# Patient Record
Sex: Male | Born: 1945
Health system: Southern US, Community
[De-identification: ages and names within clinical notes are randomized; demographics above are authoritative.]

---

## 2014-08-31 DIAGNOSIS — H402211 Chronic angle-closure glaucoma, right eye, mild stage: Secondary | ICD-10-CM | POA: Diagnosis not present

## 2014-09-22 DIAGNOSIS — H402211 Chronic angle-closure glaucoma, right eye, mild stage: Secondary | ICD-10-CM | POA: Diagnosis not present

## 2014-10-27 DIAGNOSIS — H524 Presbyopia: Secondary | ICD-10-CM | POA: Diagnosis not present

## 2014-10-27 DIAGNOSIS — H40033 Anatomical narrow angle, bilateral: Secondary | ICD-10-CM | POA: Diagnosis not present

## 2015-01-23 DIAGNOSIS — S83242A Other tear of medial meniscus, current injury, left knee, initial encounter: Secondary | ICD-10-CM | POA: Diagnosis not present

## 2015-01-30 DIAGNOSIS — D485 Neoplasm of uncertain behavior of skin: Secondary | ICD-10-CM | POA: Diagnosis not present

## 2015-01-30 DIAGNOSIS — L821 Other seborrheic keratosis: Secondary | ICD-10-CM | POA: Diagnosis not present

## 2015-01-30 DIAGNOSIS — D1801 Hemangioma of skin and subcutaneous tissue: Secondary | ICD-10-CM | POA: Diagnosis not present

## 2015-01-30 DIAGNOSIS — M25562 Pain in left knee: Secondary | ICD-10-CM | POA: Diagnosis not present

## 2015-01-30 DIAGNOSIS — D2239 Melanocytic nevi of other parts of face: Secondary | ICD-10-CM | POA: Diagnosis not present

## 2015-01-30 DIAGNOSIS — M25662 Stiffness of left knee, not elsewhere classified: Secondary | ICD-10-CM | POA: Diagnosis not present

## 2015-01-30 DIAGNOSIS — M25462 Effusion, left knee: Secondary | ICD-10-CM | POA: Diagnosis not present

## 2015-01-30 DIAGNOSIS — D225 Melanocytic nevi of trunk: Secondary | ICD-10-CM | POA: Diagnosis not present

## 2015-02-05 DIAGNOSIS — M25662 Stiffness of left knee, not elsewhere classified: Secondary | ICD-10-CM | POA: Diagnosis not present

## 2015-02-05 DIAGNOSIS — M25462 Effusion, left knee: Secondary | ICD-10-CM | POA: Diagnosis not present

## 2015-02-05 DIAGNOSIS — M25562 Pain in left knee: Secondary | ICD-10-CM | POA: Diagnosis not present

## 2015-02-06 DIAGNOSIS — E291 Testicular hypofunction: Secondary | ICD-10-CM | POA: Diagnosis not present

## 2015-02-06 DIAGNOSIS — N401 Enlarged prostate with lower urinary tract symptoms: Secondary | ICD-10-CM | POA: Diagnosis not present

## 2015-02-13 DIAGNOSIS — M25662 Stiffness of left knee, not elsewhere classified: Secondary | ICD-10-CM | POA: Diagnosis not present

## 2015-02-13 DIAGNOSIS — M25462 Effusion, left knee: Secondary | ICD-10-CM | POA: Diagnosis not present

## 2015-02-13 DIAGNOSIS — M25562 Pain in left knee: Secondary | ICD-10-CM | POA: Diagnosis not present

## 2015-02-20 DIAGNOSIS — M25662 Stiffness of left knee, not elsewhere classified: Secondary | ICD-10-CM | POA: Diagnosis not present

## 2015-02-20 DIAGNOSIS — M25562 Pain in left knee: Secondary | ICD-10-CM | POA: Diagnosis not present

## 2015-02-20 DIAGNOSIS — M25462 Effusion, left knee: Secondary | ICD-10-CM | POA: Diagnosis not present

## 2015-02-27 DIAGNOSIS — M25662 Stiffness of left knee, not elsewhere classified: Secondary | ICD-10-CM | POA: Diagnosis not present

## 2015-02-27 DIAGNOSIS — M25562 Pain in left knee: Secondary | ICD-10-CM | POA: Diagnosis not present

## 2015-02-27 DIAGNOSIS — M25462 Effusion, left knee: Secondary | ICD-10-CM | POA: Diagnosis not present

## 2015-03-02 DIAGNOSIS — H40033 Anatomical narrow angle, bilateral: Secondary | ICD-10-CM | POA: Diagnosis not present

## 2015-03-14 DIAGNOSIS — M545 Low back pain: Secondary | ICD-10-CM | POA: Diagnosis not present

## 2015-03-14 DIAGNOSIS — M9903 Segmental and somatic dysfunction of lumbar region: Secondary | ICD-10-CM | POA: Diagnosis not present

## 2015-03-14 DIAGNOSIS — M9902 Segmental and somatic dysfunction of thoracic region: Secondary | ICD-10-CM | POA: Diagnosis not present

## 2015-03-14 DIAGNOSIS — M9904 Segmental and somatic dysfunction of sacral region: Secondary | ICD-10-CM | POA: Diagnosis not present

## 2015-03-14 DIAGNOSIS — M791 Myalgia: Secondary | ICD-10-CM | POA: Diagnosis not present

## 2015-04-10 DIAGNOSIS — Z6833 Body mass index (BMI) 33.0-33.9, adult: Secondary | ICD-10-CM | POA: Diagnosis not present

## 2015-04-10 DIAGNOSIS — H6692 Otitis media, unspecified, left ear: Secondary | ICD-10-CM | POA: Diagnosis not present

## 2015-04-18 DIAGNOSIS — F419 Anxiety disorder, unspecified: Secondary | ICD-10-CM | POA: Diagnosis not present

## 2015-04-18 DIAGNOSIS — Z0000 Encounter for general adult medical examination without abnormal findings: Secondary | ICD-10-CM | POA: Diagnosis not present

## 2015-04-18 DIAGNOSIS — E785 Hyperlipidemia, unspecified: Secondary | ICD-10-CM | POA: Diagnosis not present

## 2015-04-18 DIAGNOSIS — I10 Essential (primary) hypertension: Secondary | ICD-10-CM | POA: Diagnosis not present

## 2015-04-18 DIAGNOSIS — Z9181 History of falling: Secondary | ICD-10-CM | POA: Diagnosis not present

## 2015-04-18 DIAGNOSIS — E1142 Type 2 diabetes mellitus with diabetic polyneuropathy: Secondary | ICD-10-CM | POA: Diagnosis not present

## 2015-04-18 DIAGNOSIS — Z1389 Encounter for screening for other disorder: Secondary | ICD-10-CM | POA: Diagnosis not present

## 2015-04-18 DIAGNOSIS — Z23 Encounter for immunization: Secondary | ICD-10-CM | POA: Diagnosis not present

## 2015-04-18 DIAGNOSIS — Z139 Encounter for screening, unspecified: Secondary | ICD-10-CM | POA: Diagnosis not present

## 2015-04-18 DIAGNOSIS — Z79899 Other long term (current) drug therapy: Secondary | ICD-10-CM | POA: Diagnosis not present

## 2015-05-01 DIAGNOSIS — H1013 Acute atopic conjunctivitis, bilateral: Secondary | ICD-10-CM | POA: Diagnosis not present

## 2015-06-08 DIAGNOSIS — N401 Enlarged prostate with lower urinary tract symptoms: Secondary | ICD-10-CM | POA: Diagnosis not present

## 2015-06-08 DIAGNOSIS — E291 Testicular hypofunction: Secondary | ICD-10-CM | POA: Diagnosis not present

## 2015-07-02 DIAGNOSIS — J208 Acute bronchitis due to other specified organisms: Secondary | ICD-10-CM | POA: Diagnosis not present

## 2015-07-02 DIAGNOSIS — H402231 Chronic angle-closure glaucoma, bilateral, mild stage: Secondary | ICD-10-CM | POA: Diagnosis not present

## 2015-07-12 DIAGNOSIS — J208 Acute bronchitis due to other specified organisms: Secondary | ICD-10-CM | POA: Diagnosis not present

## 2015-07-12 DIAGNOSIS — Z6836 Body mass index (BMI) 36.0-36.9, adult: Secondary | ICD-10-CM | POA: Diagnosis not present

## 2015-07-12 DIAGNOSIS — R0981 Nasal congestion: Secondary | ICD-10-CM | POA: Diagnosis not present

## 2019-08-08 DIAGNOSIS — E291 Testicular hypofunction: Secondary | ICD-10-CM | POA: Diagnosis not present

## 2019-08-17 DIAGNOSIS — E119 Type 2 diabetes mellitus without complications: Secondary | ICD-10-CM | POA: Diagnosis not present

## 2019-08-17 DIAGNOSIS — H40033 Anatomical narrow angle, bilateral: Secondary | ICD-10-CM | POA: Diagnosis not present

## 2019-08-17 DIAGNOSIS — Z961 Presence of intraocular lens: Secondary | ICD-10-CM | POA: Diagnosis not present

## 2019-08-23 DIAGNOSIS — E291 Testicular hypofunction: Secondary | ICD-10-CM | POA: Diagnosis not present

## 2019-09-05 DIAGNOSIS — E291 Testicular hypofunction: Secondary | ICD-10-CM | POA: Diagnosis not present

## 2019-09-19 DIAGNOSIS — E291 Testicular hypofunction: Secondary | ICD-10-CM | POA: Diagnosis not present

## 2019-10-03 DIAGNOSIS — E291 Testicular hypofunction: Secondary | ICD-10-CM | POA: Diagnosis not present

## 2019-10-18 DIAGNOSIS — F419 Anxiety disorder, unspecified: Secondary | ICD-10-CM | POA: Diagnosis not present

## 2019-10-18 DIAGNOSIS — E559 Vitamin D deficiency, unspecified: Secondary | ICD-10-CM | POA: Diagnosis not present

## 2019-10-18 DIAGNOSIS — F329 Major depressive disorder, single episode, unspecified: Secondary | ICD-10-CM | POA: Diagnosis not present

## 2019-10-18 DIAGNOSIS — E291 Testicular hypofunction: Secondary | ICD-10-CM | POA: Diagnosis not present

## 2019-10-18 DIAGNOSIS — I10 Essential (primary) hypertension: Secondary | ICD-10-CM | POA: Diagnosis not present

## 2019-10-18 DIAGNOSIS — E785 Hyperlipidemia, unspecified: Secondary | ICD-10-CM | POA: Diagnosis not present

## 2019-10-18 DIAGNOSIS — E1165 Type 2 diabetes mellitus with hyperglycemia: Secondary | ICD-10-CM | POA: Diagnosis not present

## 2019-10-18 DIAGNOSIS — E1142 Type 2 diabetes mellitus with diabetic polyneuropathy: Secondary | ICD-10-CM | POA: Diagnosis not present

## 2019-10-18 DIAGNOSIS — S83242A Other tear of medial meniscus, current injury, left knee, initial encounter: Secondary | ICD-10-CM | POA: Diagnosis not present

## 2019-10-20 DIAGNOSIS — M25562 Pain in left knee: Secondary | ICD-10-CM | POA: Diagnosis not present

## 2019-10-20 DIAGNOSIS — M1712 Unilateral primary osteoarthritis, left knee: Secondary | ICD-10-CM | POA: Diagnosis not present

## 2019-11-04 DIAGNOSIS — E291 Testicular hypofunction: Secondary | ICD-10-CM | POA: Diagnosis not present

## 2019-11-18 DIAGNOSIS — E291 Testicular hypofunction: Secondary | ICD-10-CM | POA: Diagnosis not present

## 2019-12-06 DIAGNOSIS — E291 Testicular hypofunction: Secondary | ICD-10-CM | POA: Diagnosis not present

## 2019-12-09 DIAGNOSIS — H6692 Otitis media, unspecified, left ear: Secondary | ICD-10-CM | POA: Diagnosis not present

## 2019-12-09 DIAGNOSIS — J301 Allergic rhinitis due to pollen: Secondary | ICD-10-CM | POA: Diagnosis not present

## 2019-12-23 DIAGNOSIS — E291 Testicular hypofunction: Secondary | ICD-10-CM | POA: Diagnosis not present

## 2020-01-18 DIAGNOSIS — F331 Major depressive disorder, recurrent, moderate: Secondary | ICD-10-CM | POA: Diagnosis not present

## 2020-01-18 DIAGNOSIS — I10 Essential (primary) hypertension: Secondary | ICD-10-CM | POA: Diagnosis not present

## 2020-01-18 DIAGNOSIS — E291 Testicular hypofunction: Secondary | ICD-10-CM | POA: Diagnosis not present

## 2020-01-18 DIAGNOSIS — F419 Anxiety disorder, unspecified: Secondary | ICD-10-CM | POA: Diagnosis not present

## 2020-01-18 DIAGNOSIS — E1165 Type 2 diabetes mellitus with hyperglycemia: Secondary | ICD-10-CM | POA: Diagnosis not present

## 2020-01-18 DIAGNOSIS — E1142 Type 2 diabetes mellitus with diabetic polyneuropathy: Secondary | ICD-10-CM | POA: Diagnosis not present

## 2020-01-18 DIAGNOSIS — Z125 Encounter for screening for malignant neoplasm of prostate: Secondary | ICD-10-CM | POA: Diagnosis not present

## 2020-01-18 DIAGNOSIS — E785 Hyperlipidemia, unspecified: Secondary | ICD-10-CM | POA: Diagnosis not present

## 2020-01-18 DIAGNOSIS — E559 Vitamin D deficiency, unspecified: Secondary | ICD-10-CM | POA: Diagnosis not present

## 2020-01-20 DIAGNOSIS — E291 Testicular hypofunction: Secondary | ICD-10-CM | POA: Diagnosis not present

## 2020-02-13 NOTE — Progress Notes (Signed)
New Patient Note  RE: Devin Snyder MRN: [id number] DOB: [birth year] Date of Office Visit: 02/14/2020  Referring provider: No ref. provider found Primary care provider: Nicoletta Dress, MD  Chief Complaint: Allergic Rhinitis   History of Present Illness: I had the pleasure of seeing Devin Snyder for initial evaluation at the [facility] on 02/14/2020. He is a 74 y.o. male, who is self-referred here for the evaluation of environmental and food allergies.  Rhinitis:  He reports symptoms of PND, increase mucous production, itchy eyes. Symptoms have been going on for 4 years. The symptoms are present  all year around with worsening in the spring and fall and at night. Other triggers include exposure to pollen. Anosmia: no. Headache: no. He has used allegra, zyrtec, Claritin with no benefit. Tried azelastine 2 sprays per nostril with minimal improvement in symptoms. Sinus infections: once. Previous work up includes: 12 years ago testing was positive to dust mites, pollen and mold per patient report. No previous allergy injections  Previous ENT evaluation: no. Previous sinus imaging: no. History of nasal polyps: no. History of reflux: not on medications.  Food: Currently avoiding dairy, eggs, limiting red meat.   Symptoms started within 20-30 minutes and was in the form of PND and mucous production. Denies any hives, swelling, wheezing, abdominal pain, diarrhea, vomiting. Denies any associated cofactors such as exertion, infection, NSAID use, or alcohol consumption. The symptoms lasted for up to 1 hour. He was not evaluated in ED. He does not have access to epinephrine autoinjector.  Peanuts cause diarrhea the following day.   Wheat products cause less severe reactions.   Past work up includes: none. Dietary History: patient has been eating other foods including treenuts, shellfish, seafood, soy, meats, fruits and vegetables. No prior sesame ingestion.   Assessment  and Plan: Brook is a 74 y.o. male with: Other allergic rhinitis Perennial rhino conjunctivitis symptoms for the past 4 years with worsening in the spring, fall and at night. Main complaint is the PND and mucous production. Tried allegra, zyrtec, Claritin and azelastine with minimal benefit. Skin testing 12 years ago positive to dust mites, pollen and mold per patient report. No previous allergy injections. No prior ENT evaluation. Some heartburn symptoms but not on medications.  Today's skin testing showed: Positive to grass, ragweed, weed, trees, mold, cockroach and dust mites.  Start environmental control measures as below.  Start Karbinal 3m tablet twice a day. Sample given.   [place] if it's not covered.   Start Atrovent nasal spray 2 sprays per nostril twice a day for drainage.  Nasal saline spray (i.e., Simply Saline) or nasal saline lavage (i.e., NeilMed) is recommended as needed and prior to medicated nasal sprays.  If no improvement will recommend ENT referral next and starting PPI.  See below for heartburn lifestyle modifications diet.  Read about allergy injections.   Coughing The excessive PND and mucous sometimes leads to his coughing. Using albuterol a few times a day with good benefit. No recent CXR.  Today's spirometry was unremarkable.  Discussed with patient at length to only use albuterol if he experiencing symptoms of shortness of breath, chest tightness, coughing fits and wheezing and NOT to use for nasal congestion.   May use albuterol rescue inhaler 2 puffs every 4 to 6 hours as needed for shortness of breath, chest tightness, coughing, and wheezing. Monitor frequency of use.   If no improvement with above regimen will get CXR at next  visit.  Heartburn  Gave handout on reflux lifestyle modification.   Adverse food reaction Noted that certain foods such as dairy, eggs, red meat and wheat causes worsening PND and mucous production. Symptoms lasts for  1 hour. Peanuts cause diarrhea the following day.   Today's skin testing was negative to select foods.  Avoid the foods that bother you - dairy, egg, red meat, and peanuts.  Discussed with patient that we can discuss reintroduction at the next visit once his above symptoms improve. May need to get bloodwork for these selects foods prior to reintroduction.   Return in about 2 months (around 04/16/2020).  Meds ordered this encounter  Medications  . Carbinoxamine Maleate (RYVENT) 6 MG TABS    Sig: Take 1 tablet by mouth in the morning and at bedtime.    Dispense:  60 tablet    Refill:  5  . ipratropium (ATROVENT) 0.03 % nasal spray    Sig: Place 2 sprays into both nostrils every 12 (twelve) hours. For drainage.    Dispense:  30 mL    Refill:  5   Other allergy screening: Asthma:  Patient uses albuterol as needed about a few times a day with good benefit. He has used it for the past few years. No recent CXR or breathing test.  Medication allergy: no Hymenoptera allergy: no Urticaria: no Eczema:no History of recurrent infections suggestive of immunodeficency: no  Diagnostics: Spirometry:  Tracings reviewed. His effort: It was hard to get consistent efforts and there is a question as to whether this reflects a maximal maneuver. FVC: 4.02L FEV1: 3.23L, 115% predicted FEV1/FVC ratio: 80% Interpretation: No overt abnormalities noted given today's efforts.  Please see scanned spirometry results for details.  Skin Testing: Environmental allergy panel and select foods. Positive to grass, ragweed, weed, trees, mold, cockroach and dust mites. Negative test to: select foods as below Results discussed with patient/family.  Airborne Adult Perc - 02/14/20 1448    Time Antigen Placed 1449    Allergen Manufacturer Lavella Hammock    Location Back    Number of Test 59    Panel 1 Select    1. Control-Buffer 50% Glycerol Negative    2. Control-Histamine 1 mg/ml 2+    3. Albumin saline Negative     4. [place] Negative    5. [place] 3+    6. Johnson Negative    7. [place] Blue Negative    8. Meadow Fescue Negative    9. Perennial Rye Negative    10. Sweet Vernal Negative    11. Timothy Negative    12. Cocklebur Negative    13. Burweed Marshelder Negative    14. Ragweed, short Negative    15. Ragweed, Giant Negative    16. Plantain,  English Negative    17. Lamb's Quarters Negative    18. Sheep Sorrell Negative    19. Rough Pigweed Negative    20. Marsh Elder, Rough Negative    21. Mugwort, Common Negative    22. Ash mix Negative    23. Birch mix Negative    24. Beech American Negative    25. Box, Elder Negative    26. Cedar, red Negative    27. Cottonwood, [place] Negative    28. Elm mix Negative    29. Hickory Negative    30. Maple mix Negative    31. Oak, [place] mix Negative    32. Pecan Pollen Negative    33. Pine mix Negative    34. Sycamore  Eastern Negative    35. Ritchie, Black Pollen Negative    36. Alternaria alternata Negative    37. Cladosporium Herbarum Negative    38. Aspergillus mix Negative    39. Penicillium mix Negative    40. Bipolaris sorokiniana (Helminthosporium) Negative    41. Drechslera spicifera (Curvularia) Negative    42. Mucor plumbeus Negative    43. Fusarium moniliforme Negative    44. Aureobasidium pullulans (pullulara) Negative    45. Rhizopus oryzae Negative    46. Botrytis cinera Negative    47. Epicoccum nigrum Negative    48. Phoma betae Negative    49. Candida Albicans Negative    50. Trichophyton mentagrophytes Negative    51. Mite, D Farinae  5,000 AU/ml Negative    52. Mite, D Pteronyssinus  5,000 AU/ml Negative    53. Cat Hair 10,000 BAU/ml Negative    54.  Dog Epithelia Negative    55. Mixed Feathers Negative    56. Horse Epithelia Negative    57. Cockroach, German Negative    58. Mouse Negative    59. Tobacco Leaf Negative          Intradermal - 02/14/20 1515    Time Antigen Placed 0320    Allergen Manufacturer  Lavella Hammock    Location Arm    Number of Test 14    Intradermal Select    Control Negative    Johnson 2+    7 Grass Negative    Ragweed mix 2+    Weed mix 2+    Tree mix 2+    Mold 1 2+    Mold 2 --   +/-   Mold 3 Negative    Mold 4 --   +/-   Cat Negative    Dog Negative    Cockroach 2+    Mite mix 2+          Food Adult Perc - 02/14/20 1400    Time Antigen Placed 1449    Allergen Manufacturer Lavella Hammock    Location Back    Panel 2 Select    Control-Histamine 1 mg/ml 2+    1. Peanut Negative    3. Wheat Negative    4. Sesame Negative    5. Milk, cow Negative    6. Egg White, Chicken Negative    7. Casein Negative    37. Pork Negative    40. Beef Negative    41. Lamb Negative           Past Medical History: Patient Active Problem List   Diagnosis Date Noted  . Adverse food reaction 02/14/2020  . Heartburn 02/14/2020  . Other allergic rhinitis 02/14/2020  . Coughing 02/14/2020   History reviewed. No pertinent past medical history. Past Surgical History: History reviewed. No pertinent surgical history. Medication List:  Current Outpatient Medications  Medication Sig Dispense Refill  . albuterol (VENTOLIN HFA) 108 (90 Base) MCG/ACT inhaler     . Alcohol Swabs (B-D SINGLE USE SWABS REGULAR) PADS     . ALPRAZolam (XANAX) 0.25 MG tablet Take 0.25 mg by mouth at bedtime as needed for anxiety.    Marland Kitchen azelastine (ASTELIN) 0.1 % nasal spray     . Blood Glucose Calibration (TRUE METRIX LEVEL 1) Low SOLN     . Blood Glucose Monitoring Suppl (TRUE METRIX AIR GLUCOSE METER) w/Device KIT     . Cholecalciferol (VITAMIN D-3) 125 MCG (5000 UT) TABS Take by mouth.    Marland Kitchen lisinopril (ZESTRIL) 20 MG  tablet     . metFORMIN (GLUCOPHAGE) 500 MG tablet     . Multiple Vitamin (MULTIVITAMINS PO) Take 1 tablet by mouth daily.    . Multiple Vitamins-Minerals (HAIR/SKIN/NAILS/BIOTIN PO) Take 5,000 mcg by mouth in the morning and at bedtime.    . simvastatin (ZOCOR) 20 MG tablet     . TRUE  METRIX BLOOD GLUCOSE TEST test strip     . TRUEplus Lancets 28G MISC     . venlafaxine XR (EFFEXOR-XR) 150 MG 24 hr capsule     . Carbinoxamine Maleate (RYVENT) 6 MG TABS Take 1 tablet by mouth in the morning and at bedtime. 60 tablet 5  . ipratropium (ATROVENT) 0.03 % nasal spray Place 2 sprays into both nostrils every 12 (twelve) hours. For drainage. 30 mL 5   No current facility-administered medications for this visit.   Allergies: No Known Allergies Social History: Social History   Socioeconomic History  . Marital status: Married    Spouse name: Not on file  . Number of children: Not on file  . Years of education: Not on file  . Highest education level: Not on file  Occupational History  . Not on file  Tobacco Use  . Smoking status: Never Smoker  . Smokeless tobacco: Never Used  Vaping Use  . Vaping Use: Never used  Substance and Sexual Activity  . Alcohol use: Never  . Drug use: Never  . Sexual activity: Not on file  Other Topics Concern  . Not on file  Social History Narrative  . Not on file   Social Determinants of Health   Financial Resource Strain:   . Difficulty of Paying Living Expenses:   Food Insecurity:   . Worried About Charity fundraiser in the Last Year:   . Arboriculturist in the Last Year:   Transportation Needs:   . Film/video editor (Medical):   Marland Kitchen Lack of Transportation (Non-Medical):   Physical Activity:   . Days of Exercise per Week:   . Minutes of Exercise per Session:   Stress:   . Feeling of Stress :   Social Connections:   . Frequency of Communication with Friends and Family:   . Frequency of Social Gatherings with Friends and Family:   . Attends Religious Services:   . Active Member of Clubs or Organizations:   . Attends Archivist Meetings:   Marland Kitchen Marital Status:    Lives in an 73 year old home. Smoking: denies Occupation: retired  Programme researcher, broadcasting/film/video HistoryFreight forwarder in the house: no Charity fundraiser in the family  room: no Carpet in the bedroom: no Heating: electric Cooling: central Pet: yes 3 dogs x 12 yrs  Family History: History reviewed. No pertinent family history. Problem                               Relation Asthma                                   No  Eczema                                No  Food allergy  Brother  Allergic rhino conjunctivitis     No   Review of Systems  Constitutional: Negative for appetite change, chills, fever and unexpected weight change.  HENT: Positive for postnasal drip. Negative for congestion, rhinorrhea and sneezing.   Eyes: Negative for itching.  Respiratory: Negative for cough, chest tightness, shortness of breath and wheezing.   Cardiovascular: Negative for chest pain.  Gastrointestinal: Negative for abdominal pain.  Genitourinary: Negative for difficulty urinating.  Skin: Negative for rash.  Allergic/Immunologic: Positive for environmental allergies.  Neurological: Negative for headaches.   Objective: BP (!) 134/82   Pulse 103   Temp (!) 97.2 F (36.2 C) (Temporal)   Resp 17   Ht 5' 7.72" (1.72 m)   Wt 197 lb 4 oz (89.5 kg)   SpO2 97%   BMI 30.24 kg/m  Body mass index is 30.24 kg/m. Physical Exam Vitals and nursing note reviewed.  Constitutional:      Appearance: Normal appearance. He is well-developed.  HENT:     Head: Normocephalic and atraumatic.     Right Ear: External ear normal.     Left Ear: External ear normal.     Nose: Nose normal.     Mouth/Throat:     Mouth: Mucous membranes are moist.     Pharynx: Oropharynx is clear.  Eyes:     Conjunctiva/sclera: Conjunctivae normal.  Cardiovascular:     Rate and Rhythm: Normal rate and regular rhythm.     Heart sounds: Normal heart sounds. No murmur heard.  No friction rub. No gallop.   Pulmonary:     Effort: Pulmonary effort is normal.     Breath sounds: Normal breath sounds. No wheezing, rhonchi or rales.  Abdominal:     Palpations: Abdomen is soft.    Musculoskeletal:     Cervical back: Neck supple.  Skin:    General: Skin is warm.     Findings: No rash.  Neurological:     Mental Status: He is alert and oriented to person, place, and time.  Psychiatric:        Behavior: Behavior normal.    The plan was reviewed with the patient/family, and all questions/concerned were addressed.  It was my pleasure to see Lejuan today and participate in his care. Please feel free to contact me with any questions or concerns.  Sincerely,  Rexene Alberts, DO Allergy & Immunology  [facility] office: [phone] [facility] office: [place] office: [phone]

## 2020-02-14 ENCOUNTER — Ambulatory Visit: Payer: Self-pay | Admitting: Allergy

## 2020-02-14 ENCOUNTER — Other Ambulatory Visit: Payer: Self-pay

## 2020-02-14 ENCOUNTER — Encounter: Payer: Self-pay | Admitting: Allergy

## 2020-02-14 VITALS — BP 134/82 | HR 103 | Temp 97.2°F | Resp 17 | Ht 67.72 in | Wt 197.2 lb

## 2020-02-14 DIAGNOSIS — R059 Cough, unspecified: Secondary | ICD-10-CM | POA: Insufficient documentation

## 2020-02-14 DIAGNOSIS — R12 Heartburn: Secondary | ICD-10-CM

## 2020-02-14 DIAGNOSIS — J3089 Other allergic rhinitis: Secondary | ICD-10-CM | POA: Diagnosis not present

## 2020-02-14 DIAGNOSIS — R05 Cough: Secondary | ICD-10-CM | POA: Diagnosis not present

## 2020-02-14 DIAGNOSIS — T781XXD Other adverse food reactions, not elsewhere classified, subsequent encounter: Secondary | ICD-10-CM | POA: Diagnosis not present

## 2020-02-14 DIAGNOSIS — T781XXA Other adverse food reactions, not elsewhere classified, initial encounter: Secondary | ICD-10-CM | POA: Insufficient documentation

## 2020-02-14 MED ORDER — IPRATROPIUM BROMIDE 0.03 % NA SOLN: 2.0000 | Freq: Two times a day (BID) | NASAL | 5 refills | Status: DC

## 2020-02-14 MED ORDER — CARBINOXAMINE MALEATE 6 MG PO TABS: 1.0000 | ORAL_TABLET | Freq: Two times a day (BID) | ORAL | 5 refills | Status: DC

## 2020-02-14 NOTE — Assessment & Plan Note (Addendum)
Perennial rhino conjunctivitis symptoms for the past 4 years with worsening in the spring, fall and at night. Main complaint is the PND and mucous production. Tried allegra, zyrtec, Claritin and azelastine with minimal benefit. Skin testing 12 years ago positive to dust mites, pollen and mold per patient report. No previous allergy injections. No prior ENT evaluation. Some heartburn symptoms but not on medications.  Today's skin testing showed: Positive to grass, ragweed, weed, trees, mold, cockroach and dust mites.  Start environmental control measures as below.  Start Karbinal 6mg  tablet twice a day. Sample given.   Let know if it's not covered.   Start Atrovent nasal spray 2 sprays per nostril twice a day for drainage.  Nasal saline spray (i.e., Simply Saline) or nasal saline lavage (i.e., NeilMed) is recommended as needed and prior to medicated nasal sprays.  If no improvement will recommend ENT referral next and starting PPI.  See below for heartburn lifestyle modifications diet.  Read about allergy injections.

## 2020-02-14 NOTE — Assessment & Plan Note (Addendum)
The excessive PND and mucous sometimes leads to his coughing. Using albuterol a few times a day with good benefit. No recent CXR.  Today's spirometry was unremarkable.  Discussed with patient at length to only use albuterol if he experiencing symptoms of shortness of breath, chest tightness, coughing fits and wheezing and NOT to use for nasal congestion.   May use albuterol rescue inhaler 2 puffs every 4 to 6 hours as needed for shortness of breath, chest tightness, coughing, and wheezing. Monitor frequency of use.   If no improvement with above regimen will get CXR at next visit.

## 2020-02-14 NOTE — Assessment & Plan Note (Signed)
Noted that certain foods such as dairy, eggs, red meat and wheat causes worsening PND and mucous production. Symptoms lasts for 1 hour. Peanuts cause diarrhea the following day.   Today's skin testing was negative to select foods.  Avoid the foods that bother you - dairy, egg, red meat, and peanuts.  Discussed with patient that we can discuss reintroduction at the next visit once his above symptoms improve. May need to get bloodwork for these selects foods prior to reintroduction.

## 2020-02-14 NOTE — Assessment & Plan Note (Signed)
   Gave handout on reflux lifestyle modification.

## 2020-02-14 NOTE — Patient Instructions (Addendum)
Today's skin testing showed: Positive to grass, ragweed, weed, trees, mold, cockroach and dust mites.  Environmental allergies  Start environmental control measures as below.  Start Karbinal 6mg  tablet twice a day. Sample given.   Let know if it's not covered.  Start atrovent nasal spray 2 sprays per nostril twice a day for drainage.  Nasal saline spray (i.e., Simply Saline) or nasal saline lavage (i.e., NeilMed) is recommended as needed and prior to medicated nasal sprays.  If no improvement will recommend ENT referral next.   See below for heartburn lifestyle modifications diet.  Read about allergy injections.   Breathing:  Only use if having issues with the cheat area and NOT for nasal congestion.   May use albuterol rescue inhaler 2 puffs every 4 to 6 hours as needed for shortness of breath, chest tightness, coughing, and wheezing. Monitor frequency of use.   Food:  Avoid the foods that bother you - dairy, egg, red meat, and peanuts.  Follow up in 2 months or sooner if needed.   Reducing Pollen Exposure . Pollen seasons: trees (spring), grass (summer) and ragweed/weeds (fall). [birth year]1980 Keep windows closed in your home and car to lower pollen exposure.  Marland Kitchen air conditioning in the bedroom and throughout the house if possible.  . Avoid going out in dry windy days - especially early morning. . Pollen counts are highest between 5 - 10 AM and on dry, hot and windy days.  . Save outside activities for late afternoon or after a heavy rain, when pollen levels are lower.  . Avoid mowing of grass if you have grass pollen allergy. Devin Snyder Be aware that pollen can also be transported indoors on people and pets.  . Dry your clothes in an automatic dryer rather than hanging them outside where they might collect pollen.  . Rinse hair and eyes before bedtime.  Mold Control . Mold and fungi can grow on a variety of surfaces provided certain temperature and moisture conditions exist.   . Outdoor molds grow on plants, decaying vegetation and soil. The major outdoor mold, Alternaria and Cladosporium, are found in very high numbers during hot and dry conditions. Generally, a late summer - fall peak is seen for common outdoor fungal spores. Rain will temporarily lower outdoor mold spore count, but counts rise rapidly when the rainy period ends. . The most important indoor molds are Aspergillus and Penicillium. Dark, humid and poorly ventilated basements are ideal sites for mold growth. The next most common sites of mold growth are the bathroom and the kitchen. Outdoor (Seasonal) Mold Control . Use air conditioning and keep windows closed. . Avoid exposure to decaying vegetation. 05-16-1975 Avoid leaf raking. . Avoid grain handling. . Consider wearing a face mask if working in moldy areas.  Indoor (Perennial) Mold Control  . Maintain humidity below 50%. . Get rid of mold growth on hard surfaces with water, detergent and, if necessary, 5% bleach (do not mix with other cleaners). Then dry the area completely. If mold covers an area more than 10 square feet, consider hiring an indoor environmental professional. . For clothing, washing with soap and water is best. If moldy items cannot be cleaned and dried, throw them away. . Remove sources e.g. contaminated carpets. . Repair and seal leaking roofs or pipes. Using dehumidifiers in damp basements may be helpful, but empty the water and clean units regularly to prevent mildew from forming. All rooms, especially basements, bathrooms and kitchens, require ventilation and cleaning to deter  mold and mildew growth. Avoid carpeting on concrete or damp floors, and storing items in damp areas. Cockroach Allergen Avoidance Cockroaches are often found in the homes of densely populated urban areas, schools or commercial buildings, but these creatures can lurk almost anywhere. This does not mean that you have a dirty house or living area. . Block all areas  where roaches can enter the home. This includes crevices, wall cracks and windows.  . Cockroaches need water to survive, so fix and seal all leaky faucets and pipes. Have an exterminator go through the house when your family and pets are gone to eliminate any remaining roaches. Marland Kitchen. Keep food in lidded containers and put pet food dishes away after your pets are done eating. Vacuum and sweep the floor after meals, and take out garbage and recyclables. Use lidded garbage containers in the kitchen. Wash dishes immediately after use and clean under stoves, refrigerators or toasters where crumbs can accumulate. Wipe off the stove and other kitchen surfaces and cupboards regularly. Control of House Dust Mite Allergen . Dust mite allergens are a common trigger of allergy and asthma symptoms. While they can be found throughout the house, these microscopic creatures thrive in warm, humid environments such as bedding, upholstered furniture and carpeting. . Because so much time is spent in the bedroom, it is essential to reduce mite levels there.  . Encase pillows, mattresses, and box springs in special allergen-proof fabric covers or airtight, zippered plastic covers.  . Bedding should be washed weekly in hot water (130 F) and dried in a hot dryer. Allergen-proof covers are available for comforters and pillows that can't be regularly washed.  Devin Snyder. Wash the allergy-proof covers every few months. Minimize clutter in the bedroom. Keep pets out of the bedroom.  Marland Kitchen. Keep humidity less than 50% by using a dehumidifier or air conditioning. You can buy a humidity measuring device called a hygrometer to monitor this.  . If possible, replace carpets with hardwood, linoleum, or washable area rugs. If that's not possible, vacuum frequently with a vacuum that has a HEPA filter. . Remove all upholstered furniture and non-washable window drapes from the bedroom. . Remove all non-washable stuffed toys from the bedroom.  Wash stuffed  toys weekly.  Buffered Isotonic Saline Irrigations:  Goal: . When you irrigate with the isotonic saline (salt water) it washes mucous and other debris from your nose that could be contributing to your nasal symptoms.   Recipe: Marland Kitchen. Obtain 1 quart jar that is clean . Fill with clean (bottled, boiled or distilled) water . Add 1-2 heaping teaspoons of salt without iodine o If the solution with 2 teaspoons of salt is too strong, adjust the amount down until better tolerated . Add 1 teaspoon of [employer] baking soda (pure bicarbonate) . Mix ingredients together and store at room temperature and discard after 1 week * Alternatively you can buy pre made salt packets for the NeilMed bottle or there          are other over the counter brands available  Instructions: . Warm  cup of the solution in the microwave if desired but be careful not to overheat as this will burn the inside of your nose . Stand over a sink (or do it while you shower) and squirt the solution into one side of your nose aiming towards the back of your head o Sometimes saying "coca cola" while irrigating can be helpful to prevent fluid from going down your throat  .  The solution will travel to the back of your nose and then come out the other side . Perform this again on the other side . Try to do this twice a day . If you are using a nasal spray in addition to the irrigation, irrigate first and then use the topical nasal spray otherwise you will wash the nasal spray out of your nose  Heartburn Heartburn is a type of pain or discomfort that can happen in the throat or chest. It is often described as a burning pain. It may also cause a bad, acid-like taste in the mouth. Heartburn may feel worse when you lie down or bend over. It may be worse at night. It may be caused by stomach contents that move back up (reflux) into the tube that connects the mouth with the stomach (esophagus). Follow these instructions at home: Eating and  drinking   Avoid certain foods and drinks as told by your doctor. This may include: ? Coffee and tea (with or without caffeine). ? Drinks that have alcohol. ? Energy drinks and sports drinks. ? Carbonated drinks or sodas. ? Chocolate and cocoa. ? Peppermint and mint flavorings. ? Garlic and onions. ? Horseradish. ? Spicy and acidic foods, such as:  Peppers.  Chili powder and curry powder.  Vinegar.  Hot sauces and BBQ sauce. ? Citrus fruit juices and citrus fruits, such as:  Oranges.  Lemons.  Limes. ? Tomato-based foods, such as:  Red sauce and pizza with red sauce.  Chili.  Salsa. ? Fried and fatty foods, such as:  Donuts.  [place] fries and potato chips.  High-fat dressings. ? High-fat meats, such as:  Hot dogs and sausage.  Rib eye steak.  Ham and bacon. ? High-fat dairy items, such as:  Whole milk.  Butter.  Cream cheese.  Eat small meals often. Avoid eating large meals.  Avoid drinking large amounts of liquid with your meals.  Avoid eating meals during the 2-3 hours before bedtime.  Avoid lying down right after you eat.  Do not exercise right after you eat. Lifestyle      If you are overweight, lose an amount of weight that is healthy for you. Ask your doctor about a safe weight loss goal.  Do not use any products that contain nicotine or tobacco, including cigarettes, e-cigarettes, and chewing tobacco. These can make your symptoms worse. If you need help quitting, ask your doctor.  Wear loose clothes. Do not wear anything tight around your waist.  Raise (elevate) the head of your bed about 6 inches (15 cm) when you sleep.  Try to lower your stress. If you need help doing this, ask your doctor. General instructions  Pay attention to any changes in your symptoms.  Take over-the-counter and prescription medicines only as told by your doctor. ? Do not take aspirin, ibuprofen, or other NSAIDs unless your doctor says it is  okay. ? Stop medicines only as told by your doctor.  Keep all follow-up visits as told by your doctor. This is important. Contact a doctor if:  You have new symptoms.  You lose weight and you do not know why it is happening.  You have trouble swallowing, or it hurts to swallow.  You have wheezing or a cough that keeps happening.  Your symptoms do not get better with treatment.  You have heartburn often for more than 2 weeks. Get help right away if:  You have pain in your arms, neck, jaw, teeth, or back.  You feel sweaty, dizzy, or light-headed.  You have chest pain or shortness of breath.  You throw up (vomit) and your throw up looks like blood or coffee grounds.  Your poop (stool) is bloody or black. These symptoms may represent a serious problem that is an emergency. Do not wait to see if the symptoms will go away. Get medical help right away. Call your local emergency services (911 in the [place] Do not drive yourself to the hospital. Summary  Heartburn is a type of pain that can happen in the throat or chest. It can feel like a burning pain. It may also cause a bad, acid-like taste in the mouth.  You may need to avoid certain foods and drinks to help your symptoms. Ask your doctor what foods and drinks you should avoid.  Take over-the-counter and prescription medicines only as told by your doctor. Do not take aspirin, ibuprofen, or other NSAIDs unless your doctor told you to do so.  Contact your doctor if your symptoms do not get better or they get worse. This information is not intended to replace advice given to you by your health care provider. Make sure you discuss any questions you have with your health care provider. Document Revised: 12/07/2017 Document Reviewed: 12/07/2017 Elsevier Patient Education  2020 [employer].

## 2020-02-15 DIAGNOSIS — E291 Testicular hypofunction: Secondary | ICD-10-CM | POA: Diagnosis not present

## 2020-02-23 DIAGNOSIS — N401 Enlarged prostate with lower urinary tract symptoms: Secondary | ICD-10-CM | POA: Diagnosis not present

## 2020-02-23 DIAGNOSIS — R972 Elevated prostate specific antigen [PSA]: Secondary | ICD-10-CM | POA: Diagnosis not present

## 2020-02-23 DIAGNOSIS — R351 Nocturia: Secondary | ICD-10-CM | POA: Diagnosis not present

## 2020-02-28 DIAGNOSIS — D1801 Hemangioma of skin and subcutaneous tissue: Secondary | ICD-10-CM | POA: Diagnosis not present

## 2020-02-28 DIAGNOSIS — D2239 Melanocytic nevi of other parts of face: Secondary | ICD-10-CM | POA: Diagnosis not present

## 2020-02-28 DIAGNOSIS — D225 Melanocytic nevi of trunk: Secondary | ICD-10-CM | POA: Diagnosis not present

## 2020-02-28 DIAGNOSIS — D485 Neoplasm of uncertain behavior of skin: Secondary | ICD-10-CM | POA: Diagnosis not present

## 2020-02-28 DIAGNOSIS — L814 Other melanin hyperpigmentation: Secondary | ICD-10-CM | POA: Diagnosis not present

## 2020-03-05 DIAGNOSIS — E291 Testicular hypofunction: Secondary | ICD-10-CM | POA: Diagnosis not present

## 2020-03-23 DIAGNOSIS — E291 Testicular hypofunction: Secondary | ICD-10-CM | POA: Diagnosis not present

## 2020-04-06 DIAGNOSIS — E291 Testicular hypofunction: Secondary | ICD-10-CM | POA: Diagnosis not present

## 2020-04-12 DIAGNOSIS — R351 Nocturia: Secondary | ICD-10-CM | POA: Diagnosis not present

## 2020-04-12 DIAGNOSIS — R972 Elevated prostate specific antigen [PSA]: Secondary | ICD-10-CM | POA: Diagnosis not present

## 2020-04-12 DIAGNOSIS — N401 Enlarged prostate with lower urinary tract symptoms: Secondary | ICD-10-CM | POA: Diagnosis not present

## 2020-04-17 ENCOUNTER — Ambulatory Visit
Admission: RE | Admit: 2020-04-17 | Discharge: 2020-04-17 | Disposition: A | Discharge status: Home / Self Care | Payer: Medicare PPO | Source: Ambulatory Visit | Attending: Allergy | Admitting: Allergy

## 2020-04-17 ENCOUNTER — Ambulatory Visit: Payer: Medicare PPO | Admitting: Allergy

## 2020-04-17 ENCOUNTER — Other Ambulatory Visit: Payer: Self-pay

## 2020-04-17 ENCOUNTER — Telehealth: Payer: Self-pay

## 2020-04-17 ENCOUNTER — Encounter: Payer: Self-pay | Admitting: Allergy

## 2020-04-17 VITALS — BP 140/92 | HR 104 | Temp 97.1°F | Resp 18

## 2020-04-17 DIAGNOSIS — J3089 Other allergic rhinitis: Secondary | ICD-10-CM | POA: Diagnosis not present

## 2020-04-17 DIAGNOSIS — R059 Cough, unspecified: Secondary | ICD-10-CM

## 2020-04-17 DIAGNOSIS — H1010 Acute atopic conjunctivitis, unspecified eye: Secondary | ICD-10-CM | POA: Insufficient documentation

## 2020-04-17 DIAGNOSIS — T781XXD Other adverse food reactions, not elsewhere classified, subsequent encounter: Secondary | ICD-10-CM | POA: Diagnosis not present

## 2020-04-17 DIAGNOSIS — J302 Other seasonal allergic rhinitis: Secondary | ICD-10-CM

## 2020-04-17 DIAGNOSIS — R05 Cough: Secondary | ICD-10-CM | POA: Diagnosis not present

## 2020-04-17 DIAGNOSIS — R12 Heartburn: Secondary | ICD-10-CM

## 2020-04-17 MED ORDER — MONTELUKAST SODIUM 10 MG PO TABS: 10.0000 mg | ORAL_TABLET | Freq: Every day | ORAL | 5 refills | Status: DC

## 2020-04-17 NOTE — Patient Instructions (Addendum)
Environmental allergies  Past skin testing showed: Positive to grass, ragweed, weed, trees, mold, cockroach and dust mites.  Continue environmental control measures as below.  Continue Karbinal 6mg  tablet twice a day.   Continue atrovent nasal spray 2 sprays per nostril twice a day for drainage.  Nasal saline spray (i.e., Simply Saline) or nasal saline lavage (i.e., NeilMed) is recommended as needed and prior to medicated nasal sprays.  Refer to ENT - chronic rhinitis  Continue heartburn lifestyle modifications diet.  Consider allergy injections in the future.  Start Singulair (montelukast) 10mg  daily at night. Cautioned that in some children/adults can experience behavioral changes including hyperactivity, agitation, depression, sleep disturbances and suicidal ideations. These side effects are rare, but if you notice them you should notify me and discontinue Singulair (montelukast).  Breathing:  Get Chest X-ray.   May use albuterol rescue inhaler 2 puffs every 4 to 6 hours as needed for shortness of breath, chest tightness, coughing, and wheezing. Monitor frequency of use.   Food:  Avoid the foods that bother you - dairy, egg, red meat, and peanuts.  Follow up in 2 months or sooner if needed.   Reducing Pollen Exposure . Pollen seasons: trees (spring), grass (summer) and ragweed/weeds (fall). 07-26-1981 Keep windows closed in your home and car to lower pollen exposure.  11-27[birth year]1980 air conditioning in the bedroom and throughout the house if possible.  . Avoid going out in dry windy days - especially early morning. . Pollen counts are highest between 5 - 10 AM and on dry, hot and windy days.  . Save outside activities for late afternoon or after a heavy rain, when pollen levels are lower.  . Avoid mowing of grass if you have grass pollen allergy. Marland Kitchen Be aware that pollen can also be transported indoors on people and pets.  . Dry your clothes in an automatic dryer rather than hanging them  outside where they might collect pollen.  . Rinse hair and eyes before bedtime.  Mold Control . Mold and fungi can grow on a variety of surfaces provided certain temperature and moisture conditions exist.  . Outdoor molds grow on plants, decaying vegetation and soil. The major outdoor mold, Alternaria and Cladosporium, are found in very high numbers during hot and dry conditions. Generally, a late summer - fall peak is seen for common outdoor fungal spores. Rain will temporarily lower outdoor mold spore count, but counts rise rapidly when the rainy period ends. . The most important indoor molds are Aspergillus and Penicillium. Dark, humid and poorly ventilated basements are ideal sites for mold growth. The next most common sites of mold growth are the bathroom and the kitchen. Outdoor (Seasonal) Mold Control . Use air conditioning and keep windows closed. . Avoid exposure to decaying vegetation. Marland Kitchen Avoid leaf raking. . Avoid grain handling. . Consider wearing a face mask if working in moldy areas.  Indoor (Perennial) Mold Control  . Maintain humidity below 50%. . Get rid of mold growth on hard surfaces with water, detergent and, if necessary, 5% bleach (do not mix with other cleaners). Then dry the area completely. If mold covers an area more than 10 square feet, consider hiring an indoor environmental professional. . For clothing, washing with soap and water is best. If moldy items cannot be cleaned and dried, throw them away. . Remove sources e.g. contaminated carpets. . Repair and seal leaking roofs or pipes. Using dehumidifiers in damp basements may be helpful, but empty the water and clean units  regularly to prevent mildew from forming. All rooms, especially basements, bathrooms and kitchens, require ventilation and cleaning to deter mold and mildew growth. Avoid carpeting on concrete or damp floors, and storing items in damp areas. Cockroach Allergen Avoidance Cockroaches are often found in  the homes of densely populated urban areas, schools or commercial buildings, but these creatures can lurk almost anywhere. This does not mean that you have a dirty house or living area. . Block all areas where roaches can enter the home. This includes crevices, wall cracks and windows.  . Cockroaches need water to survive, so fix and seal all leaky faucets and pipes. Have an exterminator go through the house when your family and pets are gone to eliminate any remaining roaches. Marland Kitchen Keep food in lidded containers and put pet food dishes away after your pets are done eating. Vacuum and sweep the floor after meals, and take out garbage and recyclables. Use lidded garbage containers in the kitchen. Wash dishes immediately after use and clean under stoves, refrigerators or toasters where crumbs can accumulate. Wipe off the stove and other kitchen surfaces and cupboards regularly. Control of House Dust Mite Allergen . Dust mite allergens are a common trigger of allergy and asthma symptoms. While they can be found throughout the house, these microscopic creatures thrive in warm, humid environments such as bedding, upholstered furniture and carpeting. . Because so much time is spent in the bedroom, it is essential to reduce mite levels there.  . Encase pillows, mattresses, and box springs in special allergen-proof fabric covers or airtight, zippered plastic covers.  . Bedding should be washed weekly in hot water (130 F) and dried in a hot dryer. Allergen-proof covers are available for comforters and pillows that can't be regularly washed.  [name] the allergy-proof covers every few months. Minimize clutter in the bedroom. Keep pets out of the bedroom.  Marland Kitchen Keep humidity less than 50% by using a dehumidifier or air conditioning. You can buy a humidity measuring device called a hygrometer to monitor this.  . If possible, replace carpets with hardwood, linoleum, or washable area rugs. If that's not possible, vacuum  frequently with a vacuum that has a HEPA filter. . Remove all upholstered furniture and non-washable window drapes from the bedroom. . Remove all non-washable stuffed toys from the bedroom.  Wash stuffed toys weekly.  Heartburn Heartburn is a type of pain or discomfort that can happen in the throat or chest. It is often described as a burning pain. It may also cause a bad, acid-like taste in the mouth. Heartburn may feel worse when you lie down or bend over. It may be worse at night. It may be caused by stomach contents that move back up (reflux) into the tube that connects the mouth with the stomach (esophagus). Follow these instructions at home: Eating and drinking   Avoid certain foods and drinks as told by your doctor. This may include: ? Coffee and tea (with or without caffeine). ? Drinks that have alcohol. ? Energy drinks and sports drinks. ? Carbonated drinks or sodas. ? Chocolate and cocoa. ? Peppermint and mint flavorings. ? Garlic and onions. ? Horseradish. ? Spicy and acidic foods, such as:  Peppers.  Chili powder and curry powder.  Vinegar.  Hot sauces and BBQ sauce. ? Citrus fruit juices and citrus fruits, such as:  Oranges.  Lemons.  Limes. ? Tomato-based foods, such as:  Red sauce and pizza with red sauce.  Chili.  Salsa. ? [place] and  fatty foods, such as:  Donuts.  [place] fries and potato chips.  High-fat dressings. ? High-fat meats, such as:  Hot dogs and sausage.  Rib eye steak.  Ham and bacon. ? High-fat dairy items, such as:  Whole milk.  Butter.  Cream cheese.  Eat small meals often. Avoid eating large meals.  Avoid drinking large amounts of liquid with your meals.  Avoid eating meals during the 2-3 hours before bedtime.  Avoid lying down right after you eat.  Do not exercise right after you eat. Lifestyle      If you are overweight, lose an amount of weight that is healthy for you. Ask your doctor about a safe weight  loss goal.  Do not use any products that contain nicotine or tobacco, including cigarettes, e-cigarettes, and chewing tobacco. These can make your symptoms worse. If you need help quitting, ask your doctor.  Wear loose clothes. Do not wear anything tight around your waist.  Raise (elevate) the head of your bed about 6 inches (15 cm) when you sleep.  Try to lower your stress. If you need help doing this, ask your doctor. General instructions  Pay attention to any changes in your symptoms.  Take over-the-counter and prescription medicines only as told by your doctor. ? Do not take aspirin, ibuprofen, or other NSAIDs unless your doctor says it is okay. ? Stop medicines only as told by your doctor.  Keep all follow-up visits as told by your doctor. This is important. Contact a doctor if:  You have new symptoms.  You lose weight and you do not know why it is happening.  You have trouble swallowing, or it hurts to swallow.  You have wheezing or a cough that keeps happening.  Your symptoms do not get better with treatment.  You have heartburn often for more than 2 weeks. Get help right away if:  You have pain in your arms, neck, jaw, teeth, or back.  You feel sweaty, dizzy, or light-headed.  You have chest pain or shortness of breath.  You throw up (vomit) and your throw up looks like blood or coffee grounds.  Your poop (stool) is bloody or black. These symptoms may represent a serious problem that is an emergency. Do not wait to see if the symptoms will go away. Get medical help right away. Call your local emergency services (911 in the [place] Do not drive yourself to the hospital. Summary  Heartburn is a type of pain that can happen in the throat or chest. It can feel like a burning pain. It may also cause a bad, acid-like taste in the mouth.  You may need to avoid certain foods and drinks to help your symptoms. Ask your doctor what foods and drinks you should avoid.  Take  over-the-counter and prescription medicines only as told by your doctor. Do not take aspirin, ibuprofen, or other NSAIDs unless your doctor told you to do so.  Contact your doctor if your symptoms do not get better or they get worse. This information is not intended to replace advice given to you by your health care provider. Make sure you discuss any questions you have with your health care provider. Document Revised: 12/07/2017 Document Reviewed: 12/07/2017 Elsevier Patient Education  2020 [employer].

## 2020-04-17 NOTE — Assessment & Plan Note (Signed)
   Continue reflux lifestyle modification.

## 2020-04-17 NOTE — Progress Notes (Signed)
Follow Up Note  RE: Devin Snyder MRN: [id number] DOB: 1946/02/18 Date of Office Visit: 04/17/2020  Referring provider: Nicoletta Dress, MD Primary care provider: Nicoletta Dress, MD  Chief Complaint: Follow-up and Allergic Rhinitis   History of Present Illness: I had the pleasure of seeing Devin Snyder for a follow up visit at the [facility] on 04/17/2020. He is a 74 y.o. male, who is being followed for allergic rhinoconjunctivitis, coughing, heartburn, adverse food reaction. His previous allergy office visit was on 02/14/2020 with Dr. Maudie Mercury. Today is a regular follow up visit.  Environmental allergies Currently taking Ryvent 1 pill twice a day and Atrovent 2 spray per nostril BID. No nosebleeds.  Noticed improvement in his PND with the 2 new medications but still has some drainage especially after going outside or if eating something he is not supposed to.   Coughing Improved with PND but still having to use albuterol in the mornings due to chest congestion and tightness. Sometimes has to use it another times in the afternoon. Rarely wakes up at night and uses albuterol.   Heartburn Denies symptoms.   Adverse food reaction Currently avoiding dairy, eggs, red meat, peanuts as sometimes it increases PND.  Assessment and Plan: Devin Snyder is a 74 y.o. male with: Seasonal and perennial allergic rhinoconjunctivitis Past history - Perennial rhino conjunctivitis symptoms for the past 4 years with worsening in the spring, fall and at night. Main complaint is the PND and mucous production. Tried allegra, zyrtec, Claritin and azelastine with minimal benefit. Skin testing 12 years ago positive to dust mites, pollen and mold per patient report. No previous allergy injections. No prior ENT evaluation. Some heartburn symptoms but not on medications. 2021 skin testing showed: Positive to grass, ragweed, weed, trees, mold, cockroach and dust mites. Interim history -  Improved symptoms with Ryvent and Atrovent.   Continue environmental control measures as below.  Continue Karbinal 92m tablet twice a day.   Continue Atrovent nasal spray 2 sprays per nostril twice a day for drainage.  Nasal saline spray (i.e., Simply Saline) or nasal saline lavage (i.e., NeilMed) is recommended as needed and prior to medicated nasal sprays.  Refer to ENT to rule out any anatomical issues.   Continue heartburn lifestyle modifications diet.  Consider allergy injections in the future.  Start Singulair (montelukast) 156mdaily at night. Cautioned that in some children/adults can experience behavioral changes including hyperactivity, agitation, depression, sleep disturbances and suicidal ideations. These side effects are rare, but if you notice them you should notify me and discontinue Singulair (montelukast).  Coughing Past history - The excessive PND and mucous sometimes leads to his coughing. Using albuterol a few times a day with good benefit. No recent CXR. 2021 spirometry was unremarkable. Interim history - still using albuterol in the mornings due to chest congestion/tightness and sometimes in the afternoon as well.  Get Chest X-ray.   May use albuterol rescue inhaler 2 puffs every 4 to 6 hours as needed for shortness of breath, chest tightness, coughing, and wheezing. Monitor frequency of use.   Start Singulair as above.  If no improvement, will consider adding on a daily ICS/LABA inhaler next.   Heartburn  Continue reflux lifestyle modification.   Adverse food reaction Past history - Noted that certain foods such as dairy, eggs, red meat and wheat causes worsening PND and mucous production. Symptoms lasts for 1 hour. Peanuts cause diarrhea the following day. 2021 skin testing was negative to  select foods.  Avoid the foods that bother you - dairy, egg, red meat, and peanuts.  Discussed with patient that we can discuss reintroduction at the next visit once  his above symptoms improve. May need to get bloodwork for these selects foods prior to reintroduction.   Return in about 2 months (around 06/17/2020).  Meds ordered this encounter  Medications  . montelukast (SINGULAIR) 10 MG tablet    Sig: Take 1 tablet (10 mg total) by mouth at bedtime.    Dispense:  30 tablet    Refill:  5   Diagnostics: None.  Medication List:  Current Outpatient Medications  Medication Sig Dispense Refill  . albuterol (VENTOLIN HFA) 108 (90 Base) MCG/ACT inhaler     . azelastine (ASTELIN) 0.1 % nasal spray     . Carbinoxamine Maleate (RYVENT) 6 MG TABS Take 1 tablet by mouth in the morning and at bedtime. 60 tablet 5  . ipratropium (ATROVENT) 0.03 % nasal spray Place 2 sprays into both nostrils every 12 (twelve) hours. For drainage. 30 mL 5  . Alcohol Swabs (B-D SINGLE USE SWABS REGULAR) PADS     . ALPRAZolam (XANAX) 0.25 MG tablet Take 0.25 mg by mouth at bedtime as needed for anxiety.    . Blood Glucose Calibration (TRUE METRIX LEVEL 1) Low SOLN     . Blood Glucose Monitoring Suppl (TRUE METRIX AIR GLUCOSE METER) w/Device KIT     . Cholecalciferol (VITAMIN D-3) 125 MCG (5000 UT) TABS Take by mouth.    Marland Kitchen lisinopril (ZESTRIL) 20 MG tablet     . metFORMIN (GLUCOPHAGE) 500 MG tablet     . montelukast (SINGULAIR) 10 MG tablet Take 1 tablet (10 mg total) by mouth at bedtime. 30 tablet 5  . Multiple Vitamin (MULTIVITAMINS PO) Take 1 tablet by mouth daily.    . Multiple Vitamins-Minerals (HAIR/SKIN/NAILS/BIOTIN PO) Take 5,000 mcg by mouth in the morning and at bedtime.    . simvastatin (ZOCOR) 20 MG tablet     . TRUE METRIX BLOOD GLUCOSE TEST test strip     . TRUEplus Lancets 28G MISC     . venlafaxine XR (EFFEXOR-XR) 150 MG 24 hr capsule      No current facility-administered medications for this visit.   Allergies: No Known Allergies I reviewed his past medical history, social history, family history, and environmental history and no significant changes have  been reported from his previous visit.  Review of Systems  Constitutional: Negative for appetite change, chills, fever and unexpected weight change.  HENT: Positive for postnasal drip. Negative for congestion, rhinorrhea and sneezing.   Eyes: Negative for itching.  Respiratory: Positive for cough and chest tightness. Negative for shortness of breath and wheezing.   Cardiovascular: Negative for chest pain.  Gastrointestinal: Negative for abdominal pain.  Genitourinary: Negative for difficulty urinating.  Skin: Negative for rash.  Allergic/Immunologic: Positive for environmental allergies.  Neurological: Negative for headaches.   Objective: BP (!) 140/92   Pulse (!) 104   Temp (!) 97.1 F (36.2 C) (Temporal)   Resp 18   SpO2 95%  There is no height or weight on file to calculate BMI. Physical Exam Vitals and nursing note reviewed.  Constitutional:      Appearance: Normal appearance. He is well-developed.  HENT:     Head: Normocephalic and atraumatic.     Right Ear: External ear normal.     Left Ear: External ear normal.     Nose: Rhinorrhea present.  Mouth/Throat:     Mouth: Mucous membranes are moist.     Pharynx: Oropharynx is clear.  Eyes:     Conjunctiva/sclera: Conjunctivae normal.  Cardiovascular:     Rate and Rhythm: Normal rate and regular rhythm.     Heart sounds: Normal heart sounds. No murmur heard.  No friction rub. No gallop.   Pulmonary:     Effort: Pulmonary effort is normal.     Breath sounds: Normal breath sounds. No wheezing, rhonchi or rales.  Musculoskeletal:     Cervical back: Neck supple.  Skin:    General: Skin is warm.     Findings: No rash.  Neurological:     Mental Status: He is alert and oriented to person, place, and time.  Psychiatric:        Behavior: Behavior normal.    Previous notes and tests were reviewed. The plan was reviewed with the patient/family, and all questions/concerned were addressed.  It was my pleasure to see  Devin Snyder today and participate in his care. Please feel free to contact me with any questions or concerns.  Sincerely,  Rexene Alberts, DO Allergy & Immunology  [facility] office: [place] office: [phone]

## 2020-04-17 NOTE — Assessment & Plan Note (Signed)
Past history - Perennial rhino conjunctivitis symptoms for the past 4 years with worsening in the spring, fall and at night. Main complaint is the PND and mucous production. Tried allegra, zyrtec, Claritin and azelastine with minimal benefit. Skin testing 12 years ago positive to dust mites, pollen and mold per patient report. No previous allergy injections. No prior ENT evaluation. Some heartburn symptoms but not on medications. 2021 skin testing showed: Positive to grass, ragweed, weed, trees, mold, cockroach and dust mites. Interim history - Improved symptoms with Ryvent and Atrovent.   Continue environmental control measures as below.  Continue Karbinal 6mg  tablet twice a day.   Continue Atrovent nasal spray 2 sprays per nostril twice a day for drainage.  Nasal saline spray (i.e., Simply Saline) or nasal saline lavage (i.e., NeilMed) is recommended as needed and prior to medicated nasal sprays.  Refer to ENT to rule out any anatomical issues.   Continue heartburn lifestyle modifications diet.  Consider allergy injections in the future.  Start Singulair (montelukast) 10mg  daily at night. Cautioned that in some children/adults can experience behavioral changes including hyperactivity, agitation, depression, sleep disturbances and suicidal ideations. These side effects are rare, but if you notice them you should notify me and discontinue Singulair (montelukast).

## 2020-04-17 NOTE — Assessment & Plan Note (Signed)
Past history - The excessive PND and mucous sometimes leads to his coughing. Using albuterol a few times a day with good benefit. No recent CXR. 2021 spirometry was unremarkable. Interim history - still using albuterol in the mornings due to chest congestion/tightness and sometimes in the afternoon as well.  Get Chest X-ray.   May use albuterol rescue inhaler 2 puffs every 4 to 6 hours as needed for shortness of breath, chest tightness, coughing, and wheezing. Monitor frequency of use.   Start Singulair as above.  If no improvement, will consider adding on a daily ICS/LABA inhaler next.

## 2020-04-17 NOTE — Assessment & Plan Note (Signed)
Past history - Noted that certain foods such as dairy, eggs, red meat and wheat causes worsening PND and mucous production. Symptoms lasts for 1 hour. Peanuts cause diarrhea the following day. 2021 skin testing was negative to select foods.  Avoid the foods that bother you - dairy, egg, red meat, and peanuts.  Discussed with patient that we can discuss reintroduction at the next visit once his above symptoms improve. May need to get bloodwork for these selects foods prior to reintroduction.

## 2020-04-17 NOTE — Telephone Encounter (Signed)
Per dr Selena Batten referral to ENT for chronic rhinitis thank you

## 2020-04-19 NOTE — Telephone Encounter (Signed)
ENT Referral placed to Dr Marcheta Grammes @ [facility] per patient request.  Patient also informed of his X-Ray Results.

## 2020-04-19 NOTE — Telephone Encounter (Signed)
Left a voicemail for the patient to determine which city would he like to see an ENT. Patient does live in [place], but sees Dr Selena Batten in the [facility] office.  Thanks

## 2020-04-23 DIAGNOSIS — E291 Testicular hypofunction: Secondary | ICD-10-CM | POA: Diagnosis not present

## 2020-04-23 DIAGNOSIS — E1165 Type 2 diabetes mellitus with hyperglycemia: Secondary | ICD-10-CM | POA: Diagnosis not present

## 2020-04-23 DIAGNOSIS — Z23 Encounter for immunization: Secondary | ICD-10-CM | POA: Diagnosis not present

## 2020-04-23 DIAGNOSIS — F331 Major depressive disorder, recurrent, moderate: Secondary | ICD-10-CM | POA: Diagnosis not present

## 2020-04-23 DIAGNOSIS — E1142 Type 2 diabetes mellitus with diabetic polyneuropathy: Secondary | ICD-10-CM | POA: Diagnosis not present

## 2020-04-23 DIAGNOSIS — E785 Hyperlipidemia, unspecified: Secondary | ICD-10-CM | POA: Diagnosis not present

## 2020-04-23 DIAGNOSIS — E559 Vitamin D deficiency, unspecified: Secondary | ICD-10-CM | POA: Diagnosis not present

## 2020-04-23 DIAGNOSIS — F419 Anxiety disorder, unspecified: Secondary | ICD-10-CM | POA: Diagnosis not present

## 2020-04-23 DIAGNOSIS — I10 Essential (primary) hypertension: Secondary | ICD-10-CM | POA: Diagnosis not present

## 2020-05-10 DIAGNOSIS — Z20822 Contact with and (suspected) exposure to covid-19: Secondary | ICD-10-CM | POA: Diagnosis not present

## 2020-05-10 DIAGNOSIS — R197 Diarrhea, unspecified: Secondary | ICD-10-CM | POA: Diagnosis not present

## 2020-05-10 DIAGNOSIS — R6889 Other general symptoms and signs: Secondary | ICD-10-CM | POA: Diagnosis not present

## 2020-05-14 DIAGNOSIS — J300 Vasomotor rhinitis: Secondary | ICD-10-CM | POA: Diagnosis not present

## 2020-05-14 DIAGNOSIS — J342 Deviated nasal septum: Secondary | ICD-10-CM | POA: Diagnosis not present

## 2020-05-14 DIAGNOSIS — J309 Allergic rhinitis, unspecified: Secondary | ICD-10-CM | POA: Diagnosis not present

## 2020-05-14 DIAGNOSIS — Z87898 Personal history of other specified conditions: Secondary | ICD-10-CM | POA: Diagnosis not present

## 2020-05-14 DIAGNOSIS — R0981 Nasal congestion: Secondary | ICD-10-CM | POA: Diagnosis not present

## 2020-05-16 ENCOUNTER — Encounter: Payer: Self-pay | Admitting: Allergy

## 2020-05-16 DIAGNOSIS — E291 Testicular hypofunction: Secondary | ICD-10-CM | POA: Diagnosis not present

## 2020-05-16 NOTE — Progress Notes (Signed)
ENT notes received from Dr. Maudie Flakes. Evaluation showed deviated septum only - conservative management. See scanned report.

## 2020-06-04 DIAGNOSIS — Z139 Encounter for screening, unspecified: Secondary | ICD-10-CM | POA: Diagnosis not present

## 2020-06-04 DIAGNOSIS — Z0000 Encounter for general adult medical examination without abnormal findings: Secondary | ICD-10-CM | POA: Diagnosis not present

## 2020-06-04 DIAGNOSIS — Z1331 Encounter for screening for depression: Secondary | ICD-10-CM | POA: Diagnosis not present

## 2020-06-04 DIAGNOSIS — Z9181 History of falling: Secondary | ICD-10-CM | POA: Diagnosis not present

## 2020-06-04 DIAGNOSIS — E785 Hyperlipidemia, unspecified: Secondary | ICD-10-CM | POA: Diagnosis not present

## 2020-06-05 DIAGNOSIS — N401 Enlarged prostate with lower urinary tract symptoms: Secondary | ICD-10-CM | POA: Diagnosis not present

## 2020-06-05 DIAGNOSIS — N451 Epididymitis: Secondary | ICD-10-CM | POA: Diagnosis not present

## 2020-06-05 DIAGNOSIS — E291 Testicular hypofunction: Secondary | ICD-10-CM | POA: Diagnosis not present

## 2020-06-19 ENCOUNTER — Ambulatory Visit: Payer: Medicare PPO | Admitting: Allergy

## 2020-07-05 DIAGNOSIS — E291 Testicular hypofunction: Secondary | ICD-10-CM | POA: Diagnosis not present

## 2020-07-20 ENCOUNTER — Other Ambulatory Visit: Payer: Self-pay | Admitting: Allergy

## 2020-07-24 DIAGNOSIS — E1165 Type 2 diabetes mellitus with hyperglycemia: Secondary | ICD-10-CM | POA: Diagnosis not present

## 2020-07-24 DIAGNOSIS — E785 Hyperlipidemia, unspecified: Secondary | ICD-10-CM | POA: Diagnosis not present

## 2020-07-24 DIAGNOSIS — E1142 Type 2 diabetes mellitus with diabetic polyneuropathy: Secondary | ICD-10-CM | POA: Diagnosis not present

## 2020-07-24 DIAGNOSIS — F331 Major depressive disorder, recurrent, moderate: Secondary | ICD-10-CM | POA: Diagnosis not present

## 2020-07-24 DIAGNOSIS — E291 Testicular hypofunction: Secondary | ICD-10-CM | POA: Diagnosis not present

## 2020-07-24 DIAGNOSIS — I10 Essential (primary) hypertension: Secondary | ICD-10-CM | POA: Diagnosis not present

## 2020-07-24 DIAGNOSIS — F419 Anxiety disorder, unspecified: Secondary | ICD-10-CM | POA: Diagnosis not present

## 2020-07-24 DIAGNOSIS — E559 Vitamin D deficiency, unspecified: Secondary | ICD-10-CM | POA: Diagnosis not present

## 2020-07-25 NOTE — Progress Notes (Deleted)
Follow Up Note  RE: Devin Snyder MRN: [id number] DOB: [birth year] Date of Office Visit: 07/26/2020  Referring provider: Nicoletta Dress, MD Primary care provider: Nicoletta Dress, MD  Chief Complaint: No chief complaint on file.  History of Present Illness: I had the pleasure of seeing Devin Snyder for a follow up visit at the [facility] [place] [facility] [place] on 07/25/2020. He is a 75 y.o. male, who is being followed for allergic rhinoconjunctivitis, coughing, heartburn, adverse food reaction. His previous allergy office visit was on 04/17/2020 with Dr. Maudie Mercury. Today is a regular follow up visit.  ENT notes received from Dr. Cletis Athens. Evaluation showed deviated septum only - conservative management. See scanned report.   Seasonal and perennial allergic rhinoconjunctivitis Past history - Perennial rhino conjunctivitis symptoms for the past 4 years with worsening in the spring, fall and at night. Main complaint is the PND and mucous production. Tried allegra, zyrtec, Claritin and azelastine with minimal benefit. Skin testing 12 years ago positive to dust mites, pollen and mold per patient report. No previous allergy injections. No prior ENT evaluation. Some heartburn symptoms but not on medications. 2021 skin testing showed: Positive to grass, ragweed, weed, trees, mold, cockroach and dust mites. Interim history - Improved symptoms with Ryvent and Atrovent.   Continue environmental control measures as below.  Continue Karbinal 7m tablet twice a day.   Continue Atrovent nasal spray 2 sprays per nostril twice a day for drainage.  Nasal saline spray (i.e., Simply Saline) or nasal saline lavage (i.e., NeilMed) is recommended as needed and prior to medicated nasal sprays.  Refer to ENT to rule out any anatomical issues.   Continue heartburn lifestyle modifications diet.  Consider allergy injections in the future.   Start Singulair (montelukast) 115mdaily at night.  Cautioned  that in some children/adults can experience behavioral changes including hyperactivity, agitation, depression, sleep disturbances and suicidal ideations. These side effects are rare, but if you notice them you should notify me and discontinue Singulair (montelukast).  Coughing Past history - The excessive PND and mucous sometimes leads to his coughing. Using albuterol a few times a day with good benefit. No recent CXR. 2021 spirometry was unremarkable. Interim history - still using albuterol in the mornings due to chest congestion/tightness and sometimes in the afternoon as well.  Get Chest X-ray.   May use albuterol rescue inhaler 2 puffs every 4 to 6 hours as needed for shortness of breath, chest tightness, coughing, and wheezing. Monitor frequency of use.   Start Singulair as above.  If no improvement, will consider adding on a daily ICS/LABA inhaler next.   Heartburn  Continue reflux lifestyle modification.   Adverse food reaction Past history - Noted that certain foods such as dairy, eggs, red meat and wheat causes worsening PND and mucous production. Symptoms lasts for 1 hour. Peanuts cause diarrhea the following day. 2021 skin testing was negative to select foods.  Avoid the foods that bother you - dairy, egg, red meat, and peanuts.  Discussed with patient that we can discuss reintroduction at the next visit once his above symptoms improve. May need to get bloodwork for these selects foods prior to reintroduction.   Return in about 2 months (around 06/17/2020).  Assessment and Plan: [name] a [age].o. male with: No problem-specific Assessment & Plan notes found for this encounter.  No follow-ups on file.  No orders of the defined types were placed in this encounter.  Lab Orders  No  laboratory test(s) ordered today    Diagnostics: Spirometry:  Tracings reviewed. His effort: {Blank single:19197::"Good reproducible efforts.","It was hard to get consistent efforts  and there is a question as to whether this reflects a maximal maneuver.","Poor effort, data can not be interpreted."} FVC: ***L FEV1: ***L, ***% predicted FEV1/FVC ratio: ***% Interpretation: {Blank single:19197::"Spirometry consistent with mild obstructive disease","Spirometry consistent with moderate obstructive disease","Spirometry consistent with severe obstructive disease","Spirometry consistent with possible restrictive disease","Spirometry consistent with mixed obstructive and restrictive disease","Spirometry uninterpretable due to technique","Spirometry consistent with normal pattern","No overt abnormalities noted given today's efforts"}.  Please see scanned spirometry results for details.  Skin Testing: {Blank single:19197::"Select foods","Environmental allergy panel","Environmental allergy panel and select foods","Food allergy panel","None","Deferred due to recent antihistamines use"}. Positive test to: ***. Negative test to: ***.  Results discussed with patient/family.   Medication List:  Current Outpatient Medications  Medication Sig Dispense Refill  . albuterol (VENTOLIN HFA) 108 (90 Base) MCG/ACT inhaler     . Alcohol Swabs (B-D SINGLE USE SWABS REGULAR) PADS     . ALPRAZolam (XANAX) 0.25 MG tablet Take 0.25 mg by mouth at bedtime as needed for anxiety.    Marland Kitchen azelastine (ASTELIN) 0.1 % nasal spray     . Blood Glucose Calibration (TRUE METRIX LEVEL 1) Low SOLN     . Blood Glucose Monitoring Suppl (TRUE METRIX AIR GLUCOSE METER) w/Device KIT     . Carbinoxamine Maleate (RYVENT) 6 MG TABS Take 1 tablet by mouth in the morning and at bedtime. 60 tablet 5  . Cholecalciferol (VITAMIN D-3) 125 MCG (5000 UT) TABS Take by mouth.    Marland Kitchen ipratropium (ATROVENT) 0.03 % nasal spray Place 2 sprays into both nostrils every 12 (twelve) hours. For drainage. 30 mL 5  . lisinopril (ZESTRIL) 20 MG tablet     . metFORMIN (GLUCOPHAGE) 500 MG tablet     . montelukast (SINGULAIR) 10 MG tablet Take 1  tablet (10 mg total) by mouth at bedtime. 30 tablet 5  . Multiple Vitamin (MULTIVITAMINS PO) Take 1 tablet by mouth daily.    . Multiple Vitamins-Minerals (HAIR/SKIN/NAILS/BIOTIN PO) Take 5,000 mcg by mouth in the morning and at bedtime.    . simvastatin (ZOCOR) 20 MG tablet     . TRUE METRIX BLOOD GLUCOSE TEST test strip     . TRUEplus Lancets 28G MISC     . venlafaxine XR (EFFEXOR-XR) 150 MG 24 hr capsule      No current facility-administered medications for this visit.   Allergies: No Known Allergies I reviewed his past medical history, social history, family history, and environmental history and no significant changes have been reported from his previous visit.  Review of Systems  Constitutional: Negative for appetite change, chills, fever and unexpected weight change.  HENT: Positive for postnasal drip. Negative for congestion, rhinorrhea and sneezing.   Eyes: Negative for itching.  Respiratory: Positive for cough and chest tightness. Negative for shortness of breath and wheezing.   Cardiovascular: Negative for chest pain.  Gastrointestinal: Negative for abdominal pain.  Genitourinary: Negative for difficulty urinating.  Skin: Negative for rash.  Allergic/Immunologic: Positive for environmental allergies.  Neurological: Negative for headaches.   Objective: There were no vitals taken for this visit. There is no height or weight on file to calculate BMI. Physical Exam Vitals and nursing note reviewed.  Constitutional:      Appearance: Normal appearance. He is well-developed.  HENT:     Head: Normocephalic and atraumatic.     Right Ear: External ear normal.  Left Ear: External ear normal.     Nose: Rhinorrhea present.     Mouth/Throat:     Mouth: Mucous membranes are moist.     Pharynx: Oropharynx is clear.  Eyes:     Conjunctiva/sclera: Conjunctivae normal.  Cardiovascular:     Rate and Rhythm: Normal rate and regular rhythm.     Heart sounds: Normal heart sounds.  No murmur heard. No friction rub. No gallop.   Pulmonary:     Effort: Pulmonary effort is normal.     Breath sounds: Normal breath sounds. No wheezing, rhonchi or rales.  Musculoskeletal:     Cervical back: Neck supple.  Skin:    General: Skin is warm.     Findings: No rash.  Neurological:     Mental Status: He is alert and oriented to person, place, and time.  Psychiatric:        Behavior: Behavior normal.    Previous notes and tests were reviewed. The plan was reviewed with the patient/family, and all questions/concerned were addressed.  It was my pleasure to see Devin Snyder today and participate in his care. Please feel free to contact me with any questions or concerns.  Sincerely,  Rexene Alberts, DO Allergy & Immunology  [facility] office: [place] office: [phone]

## 2020-07-26 ENCOUNTER — Ambulatory Visit: Payer: Medicare PPO | Admitting: Allergy

## 2020-07-26 DIAGNOSIS — H1010 Acute atopic conjunctivitis, unspecified eye: Secondary | ICD-10-CM

## 2020-07-26 DIAGNOSIS — R12 Heartburn: Secondary | ICD-10-CM

## 2020-07-26 DIAGNOSIS — R059 Cough, unspecified: Secondary | ICD-10-CM

## 2020-07-26 DIAGNOSIS — T781XXD Other adverse food reactions, not elsewhere classified, subsequent encounter: Secondary | ICD-10-CM

## 2020-07-30 DIAGNOSIS — E1142 Type 2 diabetes mellitus with diabetic polyneuropathy: Secondary | ICD-10-CM | POA: Diagnosis not present

## 2020-07-30 DIAGNOSIS — E1165 Type 2 diabetes mellitus with hyperglycemia: Secondary | ICD-10-CM | POA: Diagnosis not present

## 2020-08-13 DIAGNOSIS — N401 Enlarged prostate with lower urinary tract symptoms: Secondary | ICD-10-CM | POA: Diagnosis not present

## 2020-08-13 DIAGNOSIS — R339 Retention of urine, unspecified: Secondary | ICD-10-CM | POA: Diagnosis not present

## 2020-08-14 DIAGNOSIS — E291 Testicular hypofunction: Secondary | ICD-10-CM | POA: Diagnosis not present

## 2020-08-23 ENCOUNTER — Other Ambulatory Visit: Payer: Self-pay | Admitting: Allergy

## 2020-09-06 DIAGNOSIS — E291 Testicular hypofunction: Secondary | ICD-10-CM | POA: Diagnosis not present

## 2020-09-19 ENCOUNTER — Ambulatory Visit: Payer: Medicare PPO | Admitting: Allergy

## 2020-10-01 DIAGNOSIS — E291 Testicular hypofunction: Secondary | ICD-10-CM | POA: Diagnosis not present

## 2020-10-16 DIAGNOSIS — E291 Testicular hypofunction: Secondary | ICD-10-CM | POA: Diagnosis not present

## 2020-11-05 DIAGNOSIS — Z1211 Encounter for screening for malignant neoplasm of colon: Secondary | ICD-10-CM | POA: Diagnosis not present

## 2020-11-05 DIAGNOSIS — E785 Hyperlipidemia, unspecified: Secondary | ICD-10-CM | POA: Diagnosis not present

## 2020-11-05 DIAGNOSIS — E559 Vitamin D deficiency, unspecified: Secondary | ICD-10-CM | POA: Diagnosis not present

## 2020-11-05 DIAGNOSIS — E291 Testicular hypofunction: Secondary | ICD-10-CM | POA: Diagnosis not present

## 2020-11-05 DIAGNOSIS — I10 Essential (primary) hypertension: Secondary | ICD-10-CM | POA: Diagnosis not present

## 2020-11-05 DIAGNOSIS — F331 Major depressive disorder, recurrent, moderate: Secondary | ICD-10-CM | POA: Diagnosis not present

## 2020-11-05 DIAGNOSIS — E1142 Type 2 diabetes mellitus with diabetic polyneuropathy: Secondary | ICD-10-CM | POA: Diagnosis not present

## 2020-11-05 DIAGNOSIS — F419 Anxiety disorder, unspecified: Secondary | ICD-10-CM | POA: Diagnosis not present

## 2020-11-05 DIAGNOSIS — E1165 Type 2 diabetes mellitus with hyperglycemia: Secondary | ICD-10-CM | POA: Diagnosis not present

## 2020-11-12 ENCOUNTER — Other Ambulatory Visit: Payer: Self-pay

## 2020-11-12 ENCOUNTER — Ambulatory Visit: Payer: Medicare PPO | Admitting: Allergy

## 2020-11-12 ENCOUNTER — Encounter: Payer: Self-pay | Admitting: Allergy

## 2020-11-12 VITALS — BP 144/80 | HR 110 | Temp 97.4°F | Resp 14 | Ht 67.5 in | Wt 202.6 lb

## 2020-11-12 DIAGNOSIS — R059 Cough, unspecified: Secondary | ICD-10-CM

## 2020-11-12 DIAGNOSIS — J302 Other seasonal allergic rhinitis: Secondary | ICD-10-CM

## 2020-11-12 DIAGNOSIS — H1013 Acute atopic conjunctivitis, bilateral: Secondary | ICD-10-CM | POA: Diagnosis not present

## 2020-11-12 DIAGNOSIS — H1010 Acute atopic conjunctivitis, unspecified eye: Secondary | ICD-10-CM

## 2020-11-12 DIAGNOSIS — T781XXD Other adverse food reactions, not elsewhere classified, subsequent encounter: Secondary | ICD-10-CM

## 2020-11-12 DIAGNOSIS — J3089 Other allergic rhinitis: Secondary | ICD-10-CM

## 2020-11-12 MED ORDER — CARBINOXAMINE MALEATE 6 MG PO TABS: ORAL_TABLET | ORAL | 5 refills | Status: DC

## 2020-11-12 MED ORDER — IPRATROPIUM BROMIDE 0.03 % NA SOLN: 2.0000 | Freq: Two times a day (BID) | NASAL | 5 refills | Status: AC

## 2020-11-12 MED ORDER — MONTELUKAST SODIUM 10 MG PO TABS: 10.0000 mg | ORAL_TABLET | Freq: Every day | ORAL | 5 refills | Status: AC

## 2020-11-12 NOTE — Progress Notes (Signed)
Follow Up Note  RE: Devin Snyder MRN: [id number] DOB: Jun 30, 1946 Date of Office Visit: 11/12/2020  Referring provider: Nicoletta Dress, MD Primary care provider: Nicoletta Dress, MD  Chief Complaint: Allergic Rhinitis  (No issues thus far. Says a better improvement than last year.)  History of Present Illness: I had the pleasure of seeing Devin Snyder for a follow up visit at the [facility] on 11/12/2020. He is a 75 y.o. male, who is being followed for allergic rhinoconjunctivitis, coughing, heartburn and adverse food reaction. His previous allergy office visit was on 04/17/2020 with Dr. Maudie Mercury. Today is a regular follow up visit.  Seasonal and perennial allergic rhinoconjunctivitis Currently taking Ryvent BID, montelukast daily with good benefit - feeling 75-80% improved with these 2 medications from last year.  Using Atrovent daily and NeilMed prn.   Saw ENT - medical management.  Coughing Coughing at times still and using albuterol 1-2 times per day with unknown benefit. Denies reflux. Normal CXR in 2021. On lisinopril for many years.  Adverse food reaction Patient has been able to little bits of dairy, wheat, egg, processed meats during the winter season but noticed worsening mucous with the increased pollen after eating these foods now.  Assessment and Plan: Devin Snyder is a 75 y.o. male with: Seasonal and perennial allergic rhinoconjunctivitis Past history - Perennial rhino conjunctivitis symptoms for the past 4 years with worsening in the spring, fall and at night. Main complaint is the PND and mucous production. Tried allegra, zyrtec, Claritin and azelastine with minimal benefit. Skin testing 12 years ago positive to dust mites, pollen and mold per patient report. No previous allergy injections. 2021 skin testing showed: Positive to grass, ragweed, weed, trees, mold, cockroach and dust mites. Interim history - ENT evaluation unremarkable. Noted  significant improvement with below regimen.  Continue environmental control measures as below.  Continue Ryvent twice a day.   Continue Atrovent nasal spray 2 sprays per nostril twice a day for drainage.  Nasal saline spray (i.e., Simply Saline) or nasal saline lavage (i.e., NeilMed) is recommended as needed and prior to medicated nasal sprays. Continue Singulair (montelukast) 80m daily at night.  Coughing Past history - The excessive PND and mucous sometimes leads to his coughing. 2021 spirometry was unremarkable. Interim history - normal CXR in 2021. Coughing unchanged and still using albuterol 1-2 times per day with unknown benefit. On lisinopril for many years.  Today's spirometry was normal.   Check with PCP regarding stopping lisinopril as that can cause coughing.   May use albuterol rescue inhaler 2 puffs every 4 to 6 hours as needed for shortness of breath, chest tightness, coughing, and wheezing. Monitor frequency of use.   If no improvement, will consider adding on a daily ICS/LABA inhaler next.   Adverse food reaction Past history - Noted that certain foods such as dairy, eggs, red meat and wheat causes worsening PND and mucous production. Symptoms lasts for 1 hour. Peanuts cause diarrhea the following day. 2021 skin testing was negative to select foods. Interim history - tolerating small amounts of dairy, egg, red meat and peanuts during no pollen season.  Avoid the foods that bother you during high pollen season - dairy, egg, red meat, and peanuts.  May eat moderately when feeling better.   Return in about 6 months (around 05/14/2021).  Meds ordered this encounter  Medications  . montelukast (SINGULAIR) 10 MG tablet    Sig: Take 1 tablet (10 mg total) by  mouth at bedtime.    Dispense:  30 tablet    Refill:  5  . ipratropium (ATROVENT) 0.03 % nasal spray    Sig: Place 2 sprays into both nostrils every 12 (twelve) hours. For drainage.    Dispense:  30 mL     Refill:  5  . Carbinoxamine Maleate (RYVENT) 6 MG TABS    Sig: Take 1 tablet by mouth in the morning and at bedtime.    Dispense:  60 tablet    Refill:  5   Lab Orders  No laboratory test(s) ordered today    Diagnostics: Spirometry:  Tracings reviewed. His effort: Good reproducible efforts. FVC: 4.05L FEV1: 3.00L, 109% predicted FEV1/FVC ratio: 74% Interpretation: Spirometry consistent with normal pattern.  Please see scanned spirometry results for details.  Medication List:  Current Outpatient Medications  Medication Sig Dispense Refill  . albuterol (VENTOLIN HFA) 108 (90 Base) MCG/ACT inhaler     . Alcohol Swabs (B-D SINGLE USE SWABS REGULAR) PADS     . ALPRAZolam (XANAX) 0.25 MG tablet Take 0.25 mg by mouth at bedtime as needed for anxiety.    . Blood Glucose Calibration (TRUE METRIX LEVEL 1) Low SOLN     . Blood Glucose Monitoring Suppl (TRUE METRIX AIR GLUCOSE METER) w/Device KIT     . Cholecalciferol (VITAMIN D-3) 125 MCG (5000 UT) TABS Take by mouth.    Marland Kitchen lisinopril (ZESTRIL) 20 MG tablet     . metFORMIN (GLUCOPHAGE) 500 MG tablet     . Multiple Vitamin (MULTIVITAMINS PO) Take 1 tablet by mouth daily.    . Multiple Vitamins-Minerals (HAIR/SKIN/NAILS/BIOTIN PO) Take 5,000 mcg by mouth in the morning and at bedtime.    . simvastatin (ZOCOR) 20 MG tablet     . TRUE METRIX BLOOD GLUCOSE TEST test strip     . TRUEplus Lancets 28G MISC     . venlafaxine XR (EFFEXOR-XR) 150 MG 24 hr capsule     . azelastine (ASTELIN) 0.1 % nasal spray  (Patient not taking: Reported on 11/12/2020)    . Carbinoxamine Maleate (RYVENT) 6 MG TABS Take 1 tablet by mouth in the morning and at bedtime. 60 tablet 5  . ipratropium (ATROVENT) 0.03 % nasal spray Place 2 sprays into both nostrils every 12 (twelve) hours. For drainage. 30 mL 5  . montelukast (SINGULAIR) 10 MG tablet Take 1 tablet (10 mg total) by mouth at bedtime. 30 tablet 5   No current facility-administered medications for this visit.    Allergies: No Known Allergies I reviewed his past medical history, social history, family history, and environmental history and no significant changes have been reported from his previous visit.  Review of Systems  Constitutional: Negative for appetite change, chills, fever and unexpected weight change.  HENT: Negative for congestion, postnasal drip, rhinorrhea and sneezing.   Eyes: Negative for itching.  Respiratory: Positive for cough. Negative for chest tightness, shortness of breath and wheezing.   Cardiovascular: Negative for chest pain.  Gastrointestinal: Negative for abdominal pain.  Genitourinary: Negative for difficulty urinating.  Skin: Negative for rash.  Allergic/Immunologic: Positive for environmental allergies.  Neurological: Negative for headaches.   Objective: BP (!) 144/80   Pulse (!) 110   Temp (!) 97.4 F (36.3 C)   Resp 14   Ht 5' 7.5" (1.715 m)   Wt 202 lb 9.6 oz (91.9 kg)   SpO2 97%   BMI 31.26 kg/m  Body mass index is 31.26 kg/m. Physical Exam Vitals and nursing note  reviewed.  Constitutional:      Appearance: Normal appearance. He is well-developed.  HENT:     Head: Normocephalic and atraumatic.     Right Ear: External ear normal.     Left Ear: External ear normal.     Nose: Rhinorrhea present.     Mouth/Throat:     Mouth: Mucous membranes are moist.     Pharynx: Oropharynx is clear.  Eyes:     Conjunctiva/sclera: Conjunctivae normal.  Cardiovascular:     Rate and Rhythm: Normal rate and regular rhythm.     Heart sounds: Normal heart sounds. No murmur heard. No friction rub. No gallop.   Pulmonary:     Effort: Pulmonary effort is normal.     Breath sounds: Normal breath sounds. No wheezing, rhonchi or rales.  Musculoskeletal:     Cervical back: Neck supple.  Skin:    General: Skin is warm.     Findings: No rash.  Neurological:     Mental Status: He is alert and oriented to person, place, and time.  Psychiatric:        Behavior:  Behavior normal.    Previous notes and tests were reviewed. The plan was reviewed with the patient/family, and all questions/concerned were addressed.  It was my pleasure to see Devin Snyder today and participate in his care. Please feel free to contact me with any questions or concerns.  Sincerely,  Rexene Alberts, DO Allergy & Immunology  [facility] office: [place] office: [phone]

## 2020-11-12 NOTE — Assessment & Plan Note (Signed)
Past history - The excessive PND and mucous sometimes leads to his coughing. 2021 spirometry was unremarkable. Interim history - normal CXR in 2021. Coughing unchanged and still using albuterol 1-2 times per day with unknown benefit. On lisinopril for many years.  Today's spirometry was normal.   Check with PCP regarding stopping lisinopril as that can cause coughing.   May use albuterol rescue inhaler 2 puffs every 4 to 6 hours as needed for shortness of breath, chest tightness, coughing, and wheezing. Monitor frequency of use.   If no improvement, will consider adding on a daily ICS/LABA inhaler next.

## 2020-11-12 NOTE — Patient Instructions (Addendum)
Environmental allergies  Past skin testing showed: Positive to grass, ragweed, weed, trees, mold, cockroach and dust mites.  Continue environmental control measures as below.  Continue Ryvent twice a day.   Continue atrovent nasal spray 2 sprays per nostril twice a day for drainage.  Nasal saline spray (i.e., Simply Saline) or nasal saline lavage (i.e., NeilMed) is recommended as needed and prior to medicated nasal sprays. Continue Singulair (montelukast) 10mg  daily at night. Cautioned that in some children/adults can experience behavioral changes including hyperactivity, agitation, depression, sleep disturbances and suicidal ideations. These side effects are rare, but if you notice them you should notify me and discontinue Singulair (montelukast).  Breathing/coughing:  Normal breathing test today.  Check with PCP regarding stopping lisinopril as that can cause coughing.   May use albuterol rescue inhaler 2 puffs every 4 to 6 hours as needed for shortness of breath, chest tightness, coughing, and wheezing. Monitor frequency of use.   Food:  Avoid the foods that bother you during high pollen season as it makes your mucous worse - dairy, egg, red meat, and peanuts.  You may eat moderately when you are feeling better.   Follow up in 6 months or sooner if needed.   Reducing Pollen Exposure . Pollen seasons: trees (spring), grass (summer) and ragweed/weeds (fall). 11-27-1980 Keep windows closed in your home and car to lower pollen exposure.  Marland Kitchen air conditioning in the bedroom and throughout the house if possible.  . Avoid going out in dry windy days - especially early morning. . Pollen counts are highest between 5 - 10 AM and on dry, hot and windy days.  . Save outside activities for late afternoon or after a heavy rain, when pollen levels are lower.  . Avoid mowing of grass if you have grass pollen allergy. [name] Be aware that pollen can also be transported indoors on people and pets.   . Dry your clothes in an automatic dryer rather than hanging them outside where they might collect pollen.  . Rinse hair and eyes before bedtime.  Mold Control . Mold and fungi can grow on a variety of surfaces provided certain temperature and moisture conditions exist.  . Outdoor molds grow on plants, decaying vegetation and soil. The major outdoor mold, Alternaria and Cladosporium, are found in very high numbers during hot and dry conditions. Generally, a late summer - fall peak is seen for common outdoor fungal spores. Rain will temporarily lower outdoor mold spore count, but counts rise rapidly when the rainy period ends. . The most important indoor molds are Aspergillus and Penicillium. Dark, humid and poorly ventilated basements are ideal sites for mold growth. The next most common sites of mold growth are the bathroom and the kitchen. Outdoor (Seasonal) Mold Control . Use air conditioning and keep windows closed. . Avoid exposure to decaying vegetation. 05-16-1976 Avoid leaf raking. . Avoid grain handling. . Consider wearing a face mask if working in moldy areas.  Indoor (Perennial) Mold Control  . Maintain humidity below 50%. . Get rid of mold growth on hard surfaces with water, detergent and, if necessary, 5% bleach (do not mix with other cleaners). Then dry the area completely. If mold covers an area more than 10 square feet, consider hiring an indoor environmental professional. . For clothing, washing with soap and water is best. If moldy items cannot be cleaned and dried, throw them away. . Remove sources e.g. contaminated carpets. . Repair and seal leaking roofs or pipes. Using dehumidifiers in damp  basements may be helpful, but empty the water and clean units regularly to prevent mildew from forming. All rooms, especially basements, bathrooms and kitchens, require ventilation and cleaning to deter mold and mildew growth. Avoid carpeting on concrete or damp floors, and storing items in damp  areas. Cockroach Allergen Avoidance Cockroaches are often found in the homes of densely populated urban areas, schools or commercial buildings, but these creatures can lurk almost anywhere. This does not mean that you have a dirty house or living area. . Block all areas where roaches can enter the home. This includes crevices, wall cracks and windows.  . Cockroaches need water to survive, so fix and seal all leaky faucets and pipes. Have an exterminator go through the house when your family and pets are gone to eliminate any remaining roaches. Marland Kitchen Keep food in lidded containers and put pet food dishes away after your pets are done eating. Vacuum and sweep the floor after meals, and take out garbage and recyclables. Use lidded garbage containers in the kitchen. Wash dishes immediately after use and clean under stoves, refrigerators or toasters where crumbs can accumulate. Wipe off the stove and other kitchen surfaces and cupboards regularly. Control of House Dust Mite Allergen . Dust mite allergens are a common trigger of allergy and asthma symptoms. While they can be found throughout the house, these microscopic creatures thrive in warm, humid environments such as bedding, upholstered furniture and carpeting. . Because so much time is spent in the bedroom, it is essential to reduce mite levels there.  . Encase pillows, mattresses, and box springs in special allergen-proof fabric covers or airtight, zippered plastic covers.  . Bedding should be washed weekly in hot water (130 F) and dried in a hot dryer. Allergen-proof covers are available for comforters and pillows that can't be regularly washed.  [name] the allergy-proof covers every few months. Minimize clutter in the bedroom. Keep pets out of the bedroom.  Marland Kitchen Keep humidity less than 50% by using a dehumidifier or air conditioning. You can buy a humidity measuring device called a hygrometer to monitor this.  . If possible, replace carpets with hardwood,  linoleum, or washable area rugs. If that's not possible, vacuum frequently with a vacuum that has a HEPA filter. . Remove all upholstered furniture and non-washable window drapes from the bedroom. . Remove all non-washable stuffed toys from the bedroom.  Wash stuffed toys weekly.

## 2020-11-12 NOTE — Assessment & Plan Note (Addendum)
Past history - Perennial rhino conjunctivitis symptoms for the past 4 years with worsening in the spring, fall and at night. Main complaint is the PND and mucous production. Tried allegra, zyrtec, Claritin and azelastine with minimal benefit. Skin testing 12 years ago positive to dust mites, pollen and mold per patient report. No previous allergy injections. 2021 skin testing showed: Positive to grass, ragweed, weed, trees, mold, cockroach and dust mites. Interim history - ENT evaluation unremarkable. Noted significant improvement with below regimen.  Continue environmental control measures as below.  Continue Ryvent twice a day.   Continue Atrovent nasal spray 2 sprays per nostril twice a day for drainage.  Nasal saline spray (i.e., Simply Saline) or nasal saline lavage (i.e., NeilMed) is recommended as needed and prior to medicated nasal sprays. Continue Singulair (montelukast) 10mg  daily at night.

## 2020-11-12 NOTE — Assessment & Plan Note (Signed)
Past history - Noted that certain foods such as dairy, eggs, red meat and wheat causes worsening PND and mucous production. Symptoms lasts for 1 hour. Peanuts cause diarrhea the following day. 2021 skin testing was negative to select foods. Interim history - tolerating small amounts of dairy, egg, red meat and peanuts during no pollen season.  Avoid the foods that bother you during high pollen season - dairy, egg, red meat, and peanuts.  May eat moderately when feeling better.

## 2020-11-23 DIAGNOSIS — E291 Testicular hypofunction: Secondary | ICD-10-CM | POA: Diagnosis not present

## 2020-12-14 DIAGNOSIS — E291 Testicular hypofunction: Secondary | ICD-10-CM | POA: Diagnosis not present

## 2021-01-02 DIAGNOSIS — E291 Testicular hypofunction: Secondary | ICD-10-CM | POA: Diagnosis not present

## 2021-01-10 DIAGNOSIS — E119 Type 2 diabetes mellitus without complications: Secondary | ICD-10-CM | POA: Diagnosis not present

## 2021-01-18 DIAGNOSIS — E291 Testicular hypofunction: Secondary | ICD-10-CM | POA: Diagnosis not present

## 2021-02-01 DIAGNOSIS — E291 Testicular hypofunction: Secondary | ICD-10-CM | POA: Diagnosis not present

## 2021-02-13 DIAGNOSIS — R339 Retention of urine, unspecified: Secondary | ICD-10-CM | POA: Diagnosis not present

## 2021-02-13 DIAGNOSIS — N401 Enlarged prostate with lower urinary tract symptoms: Secondary | ICD-10-CM | POA: Diagnosis not present

## 2021-02-20 DIAGNOSIS — H02831 Dermatochalasis of right upper eyelid: Secondary | ICD-10-CM | POA: Diagnosis not present

## 2021-02-20 DIAGNOSIS — H02834 Dermatochalasis of left upper eyelid: Secondary | ICD-10-CM | POA: Diagnosis not present

## 2021-02-28 DIAGNOSIS — E291 Testicular hypofunction: Secondary | ICD-10-CM | POA: Diagnosis not present

## 2021-03-11 DIAGNOSIS — H6692 Otitis media, unspecified, left ear: Secondary | ICD-10-CM | POA: Diagnosis not present

## 2021-03-11 DIAGNOSIS — H9192 Unspecified hearing loss, left ear: Secondary | ICD-10-CM | POA: Diagnosis not present

## 2021-03-11 DIAGNOSIS — H9312 Tinnitus, left ear: Secondary | ICD-10-CM | POA: Diagnosis not present

## 2021-03-15 DIAGNOSIS — E291 Testicular hypofunction: Secondary | ICD-10-CM | POA: Diagnosis not present

## 2021-03-17 ENCOUNTER — Other Ambulatory Visit: Payer: Self-pay | Admitting: Allergy

## 2021-03-20 DIAGNOSIS — E1165 Type 2 diabetes mellitus with hyperglycemia: Secondary | ICD-10-CM | POA: Diagnosis not present

## 2021-03-20 DIAGNOSIS — Z79899 Other long term (current) drug therapy: Secondary | ICD-10-CM | POA: Diagnosis not present

## 2021-03-20 DIAGNOSIS — F419 Anxiety disorder, unspecified: Secondary | ICD-10-CM | POA: Diagnosis not present

## 2021-03-20 DIAGNOSIS — E559 Vitamin D deficiency, unspecified: Secondary | ICD-10-CM | POA: Diagnosis not present

## 2021-03-20 DIAGNOSIS — I10 Essential (primary) hypertension: Secondary | ICD-10-CM | POA: Diagnosis not present

## 2021-03-20 DIAGNOSIS — E291 Testicular hypofunction: Secondary | ICD-10-CM | POA: Diagnosis not present

## 2021-03-20 DIAGNOSIS — E785 Hyperlipidemia, unspecified: Secondary | ICD-10-CM | POA: Diagnosis not present

## 2021-03-20 DIAGNOSIS — E1142 Type 2 diabetes mellitus with diabetic polyneuropathy: Secondary | ICD-10-CM | POA: Diagnosis not present

## 2021-03-20 DIAGNOSIS — F331 Major depressive disorder, recurrent, moderate: Secondary | ICD-10-CM | POA: Diagnosis not present

## 2021-03-26 DIAGNOSIS — D2239 Melanocytic nevi of other parts of face: Secondary | ICD-10-CM | POA: Diagnosis not present

## 2021-03-26 DIAGNOSIS — D225 Melanocytic nevi of trunk: Secondary | ICD-10-CM | POA: Diagnosis not present

## 2021-03-26 DIAGNOSIS — L821 Other seborrheic keratosis: Secondary | ICD-10-CM | POA: Diagnosis not present

## 2021-03-26 DIAGNOSIS — D1801 Hemangioma of skin and subcutaneous tissue: Secondary | ICD-10-CM | POA: Diagnosis not present

## 2021-03-28 DIAGNOSIS — H903 Sensorineural hearing loss, bilateral: Secondary | ICD-10-CM | POA: Diagnosis not present

## 2021-03-28 DIAGNOSIS — J069 Acute upper respiratory infection, unspecified: Secondary | ICD-10-CM | POA: Diagnosis not present

## 2021-03-28 DIAGNOSIS — H61303 Acquired stenosis of external ear canal, unspecified, bilateral: Secondary | ICD-10-CM | POA: Diagnosis not present

## 2021-03-28 DIAGNOSIS — H9190 Unspecified hearing loss, unspecified ear: Secondary | ICD-10-CM | POA: Diagnosis not present

## 2021-04-01 DIAGNOSIS — E291 Testicular hypofunction: Secondary | ICD-10-CM | POA: Diagnosis not present

## 2021-04-16 DIAGNOSIS — E291 Testicular hypofunction: Secondary | ICD-10-CM | POA: Diagnosis not present

## 2021-04-19 DIAGNOSIS — Z01818 Encounter for other preprocedural examination: Secondary | ICD-10-CM | POA: Diagnosis not present

## 2021-04-19 DIAGNOSIS — H0259 Other disorders affecting eyelid function: Secondary | ICD-10-CM | POA: Diagnosis not present

## 2021-04-19 DIAGNOSIS — E119 Type 2 diabetes mellitus without complications: Secondary | ICD-10-CM | POA: Diagnosis not present

## 2021-04-19 DIAGNOSIS — H02834 Dermatochalasis of left upper eyelid: Secondary | ICD-10-CM | POA: Diagnosis not present

## 2021-04-19 DIAGNOSIS — H02831 Dermatochalasis of right upper eyelid: Secondary | ICD-10-CM | POA: Diagnosis not present

## 2021-05-03 DIAGNOSIS — E291 Testicular hypofunction: Secondary | ICD-10-CM | POA: Diagnosis not present

## 2021-05-11 ENCOUNTER — Other Ambulatory Visit: Payer: Self-pay | Admitting: Allergy

## 2021-05-15 ENCOUNTER — Ambulatory Visit: Payer: Medicare PPO | Admitting: Allergy

## 2021-05-15 NOTE — Progress Notes (Deleted)
 Follow Up Note  RE: Devin Snyder MRN: [id number] DOB: 04/17/1946 Date of Office Visit: 05/15/2021  Referring provider: Schultz, Douglas E, MD Primary care provider: Schultz, Douglas E, MD  Chief Complaint: No chief complaint on file.  History of Present Illness: I had the pleasure of seeing Devin Snyder for a follow up visit at the [facility] on 05/15/2021. He is a 75 y.o. male, who is being followed for allergic rhinoconjunctivitis, coughing and food intolerance. His previous allergy office visit was on 11/12/2020 with Dr. Kim. Today is a regular follow up visit.  Seasonal and perennial allergic rhinoconjunctivitis Past history - Perennial rhino conjunctivitis symptoms for the past 4 years with worsening in the spring, fall and at night. Main complaint is the PND and mucous production. Tried allegra, zyrtec, Claritin and azelastine with minimal benefit. Skin testing 12 years ago positive to dust mites, pollen and mold per patient report. No previous allergy injections. 2021 skin testing showed: Positive to grass, ragweed, weed, trees, mold, cockroach and dust mites. Interim history - ENT evaluation unremarkable. Noted significant improvement with below regimen. Continue environmental control measures as below. Continue Ryvent twice a day.  Continue Atrovent nasal spray 2 sprays per nostril twice a day for drainage. Nasal saline spray (i.e., Simply Saline) or nasal saline lavage (i.e., NeilMed) is recommended as needed and prior to medicated nasal sprays. Continue Singulair (montelukast) 10mg daily at night.   Coughing Past history - The excessive PND and mucous sometimes leads to his coughing. 2021 spirometry was unremarkable. Interim history - normal CXR in 2021. Coughing unchanged and still using albuterol 1-2 times per day with unknown benefit. On lisinopril for many years. Today's spirometry was normal.  Check with PCP regarding stopping lisinopril as that can  cause coughing.  May use albuterol rescue inhaler 2 puffs every 4 to 6 hours as needed for shortness of breath, chest tightness, coughing, and wheezing. Monitor frequency of use.  If no improvement, will consider adding on a daily ICS/LABA inhaler next.    Adverse food reaction Past history - Noted that certain foods such as dairy, eggs, red meat and wheat causes worsening PND and mucous production. Symptoms lasts for 1 hour. Peanuts cause diarrhea the following day. 2021 skin testing was negative to select foods. Interim history - tolerating small amounts of dairy, egg, red meat and peanuts during no pollen season. Avoid the foods that bother you during high pollen season - dairy, egg, red meat, and peanuts. May eat moderately when feeling better.    Return in about 6 months (around 05/14/2021).  Assessment and Plan: Devin Snyder is a 75 y.o. male with: No problem-specific Assessment & Plan notes found for this encounter.  No follow-ups on file.  No orders of the defined types were placed in this encounter.  Lab Orders  No laboratory test(s) ordered today    Diagnostics: Spirometry:  Tracings reviewed. His effort: {Blank single:19197::"Good reproducible efforts.","It was hard to get consistent efforts and there is a question as to whether this reflects a maximal maneuver.","Poor effort, data can not be interpreted."} FVC: ***L FEV1: ***L, ***% predicted FEV1/FVC ratio: ***% Interpretation: {Blank single:19197::"Spirometry consistent with mild obstructive disease","Spirometry consistent with moderate obstructive disease","Spirometry consistent with severe obstructive disease","Spirometry consistent with possible restrictive disease","Spirometry consistent with mixed obstructive and restrictive disease","Spirometry uninterpretable due to technique","Spirometry consistent with normal pattern","No overt abnormalities noted given today's efforts"}.  Please see scanned spirometry results for  details.  Skin Testing: {Blank single:19197::"Select foods","Environmental   allergy panel","Environmental allergy panel and select foods","Food allergy panel","None","Deferred due to recent antihistamines use"}. *** Results discussed with patient/family.   Medication List:  Current Outpatient Medications  Medication Sig Dispense Refill   albuterol (VENTOLIN HFA) 108 (90 Base) MCG/ACT inhaler      Alcohol Swabs (B-D SINGLE USE SWABS REGULAR) PADS      ALPRAZolam (XANAX) 0.25 MG tablet Take 0.25 mg by mouth at bedtime as needed for anxiety.     azelastine (ASTELIN) 0.1 % nasal spray  (Patient not taking: Reported on 11/12/2020)     Blood Glucose Calibration (TRUE METRIX LEVEL 1) Low SOLN      Blood Glucose Monitoring Suppl (TRUE METRIX AIR GLUCOSE METER) w/Device KIT      Cholecalciferol (VITAMIN D-3) 125 MCG (5000 UT) TABS Take by mouth.     ipratropium (ATROVENT) 0.03 % nasal spray Place 2 sprays into both nostrils every 12 (twelve) hours. For drainage. 30 mL 5   lisinopril (ZESTRIL) 20 MG tablet      metFORMIN (GLUCOPHAGE) 500 MG tablet      montelukast (SINGULAIR) 10 MG tablet Take 1 tablet (10 mg total) by mouth at bedtime. 30 tablet 5   Multiple Vitamin (MULTIVITAMINS PO) Take 1 tablet by mouth daily.     Multiple Vitamins-Minerals (HAIR/SKIN/NAILS/BIOTIN PO) Take 5,000 mcg by mouth in the morning and at bedtime.     RYVENT 6 MG TABS TAKE 1 TABLET IN THE MORNING AND AT NIGHT 60 tablet 5   simvastatin (ZOCOR) 20 MG tablet      TRUE METRIX BLOOD GLUCOSE TEST test strip      TRUEplus Lancets 28G MISC      venlafaxine XR (EFFEXOR-XR) 150 MG 24 hr capsule      No current facility-administered medications for this visit.   Allergies: No Known Allergies I reviewed his past medical history, social history, family history, and environmental history and no significant changes have been reported from his previous visit.  Review of Systems  Constitutional:  Negative for appetite change,  chills, fever and unexpected weight change.  HENT:  Negative for congestion, postnasal drip, rhinorrhea and sneezing.   Eyes:  Negative for itching.  Respiratory:  Positive for cough. Negative for chest tightness, shortness of breath and wheezing.   Cardiovascular:  Negative for chest pain.  Gastrointestinal:  Negative for abdominal pain.  Genitourinary:  Negative for difficulty urinating.  Skin:  Negative for rash.  Allergic/Immunologic: Positive for environmental allergies.  Neurological:  Negative for headaches.   Objective: There were no vitals taken for this visit. There is no height or weight on file to calculate BMI. Physical Exam Vitals and nursing note reviewed.  Constitutional:      Appearance: Normal appearance. He is well-developed.  HENT:     Head: Normocephalic and atraumatic.     Right Ear: External ear normal.     Left Ear: External ear normal.     Nose: Rhinorrhea present.     Mouth/Throat:     Mouth: Mucous membranes are moist.     Pharynx: Oropharynx is clear.  Eyes:     Conjunctiva/sclera: Conjunctivae normal.  Cardiovascular:     Rate and Rhythm: Normal rate and regular rhythm.     Heart sounds: Normal heart sounds. No murmur heard.   No friction rub. No gallop.  Pulmonary:     Effort: Pulmonary effort is normal.     Breath sounds: Normal breath sounds. No wheezing, rhonchi or rales.  Musculoskeletal:       Cervical back: Neck supple.  Skin:    General: Skin is warm.     Findings: No rash.  Neurological:     Mental Status: He is alert and oriented to person, place, and time.  Psychiatric:        Behavior: Behavior normal.   Previous notes and tests were reviewed. The plan was reviewed with the patient/family, and all questions/concerned were addressed.  It was my pleasure to see Devin Snyder today and participate in his care. Please feel free to contact me with any questions or concerns.  Sincerely,  Rexene Alberts, DO Allergy & Immunology  [facility] [place] office: [place] office: [phone]

## 2021-05-27 DIAGNOSIS — E291 Testicular hypofunction: Secondary | ICD-10-CM | POA: Diagnosis not present

## 2021-06-19 DIAGNOSIS — E559 Vitamin D deficiency, unspecified: Secondary | ICD-10-CM | POA: Diagnosis not present

## 2021-06-19 DIAGNOSIS — E291 Testicular hypofunction: Secondary | ICD-10-CM | POA: Diagnosis not present

## 2021-06-19 DIAGNOSIS — E1142 Type 2 diabetes mellitus with diabetic polyneuropathy: Secondary | ICD-10-CM | POA: Diagnosis not present

## 2021-06-19 DIAGNOSIS — I10 Essential (primary) hypertension: Secondary | ICD-10-CM | POA: Diagnosis not present

## 2021-06-19 DIAGNOSIS — F331 Major depressive disorder, recurrent, moderate: Secondary | ICD-10-CM | POA: Diagnosis not present

## 2021-06-19 DIAGNOSIS — Z125 Encounter for screening for malignant neoplasm of prostate: Secondary | ICD-10-CM | POA: Diagnosis not present

## 2021-06-19 DIAGNOSIS — E1165 Type 2 diabetes mellitus with hyperglycemia: Secondary | ICD-10-CM | POA: Diagnosis not present

## 2021-06-19 DIAGNOSIS — Z139 Encounter for screening, unspecified: Secondary | ICD-10-CM | POA: Diagnosis not present

## 2021-06-19 DIAGNOSIS — F419 Anxiety disorder, unspecified: Secondary | ICD-10-CM | POA: Diagnosis not present

## 2021-06-19 DIAGNOSIS — E785 Hyperlipidemia, unspecified: Secondary | ICD-10-CM | POA: Diagnosis not present

## 2021-06-27 ENCOUNTER — Ambulatory Visit: Payer: Medicare PPO | Admitting: Allergy

## 2021-07-08 DIAGNOSIS — E291 Testicular hypofunction: Secondary | ICD-10-CM | POA: Diagnosis not present

## 2021-07-14 IMAGING — CR DG CHEST 2V
2 series · 2 of 2 positions shown · non-contrast
Comparison: March 06, 2009

CLINICAL DATA: Cough.  Hypertension.

EXAM:
CHEST - 2 VIEW

[w chest pa]
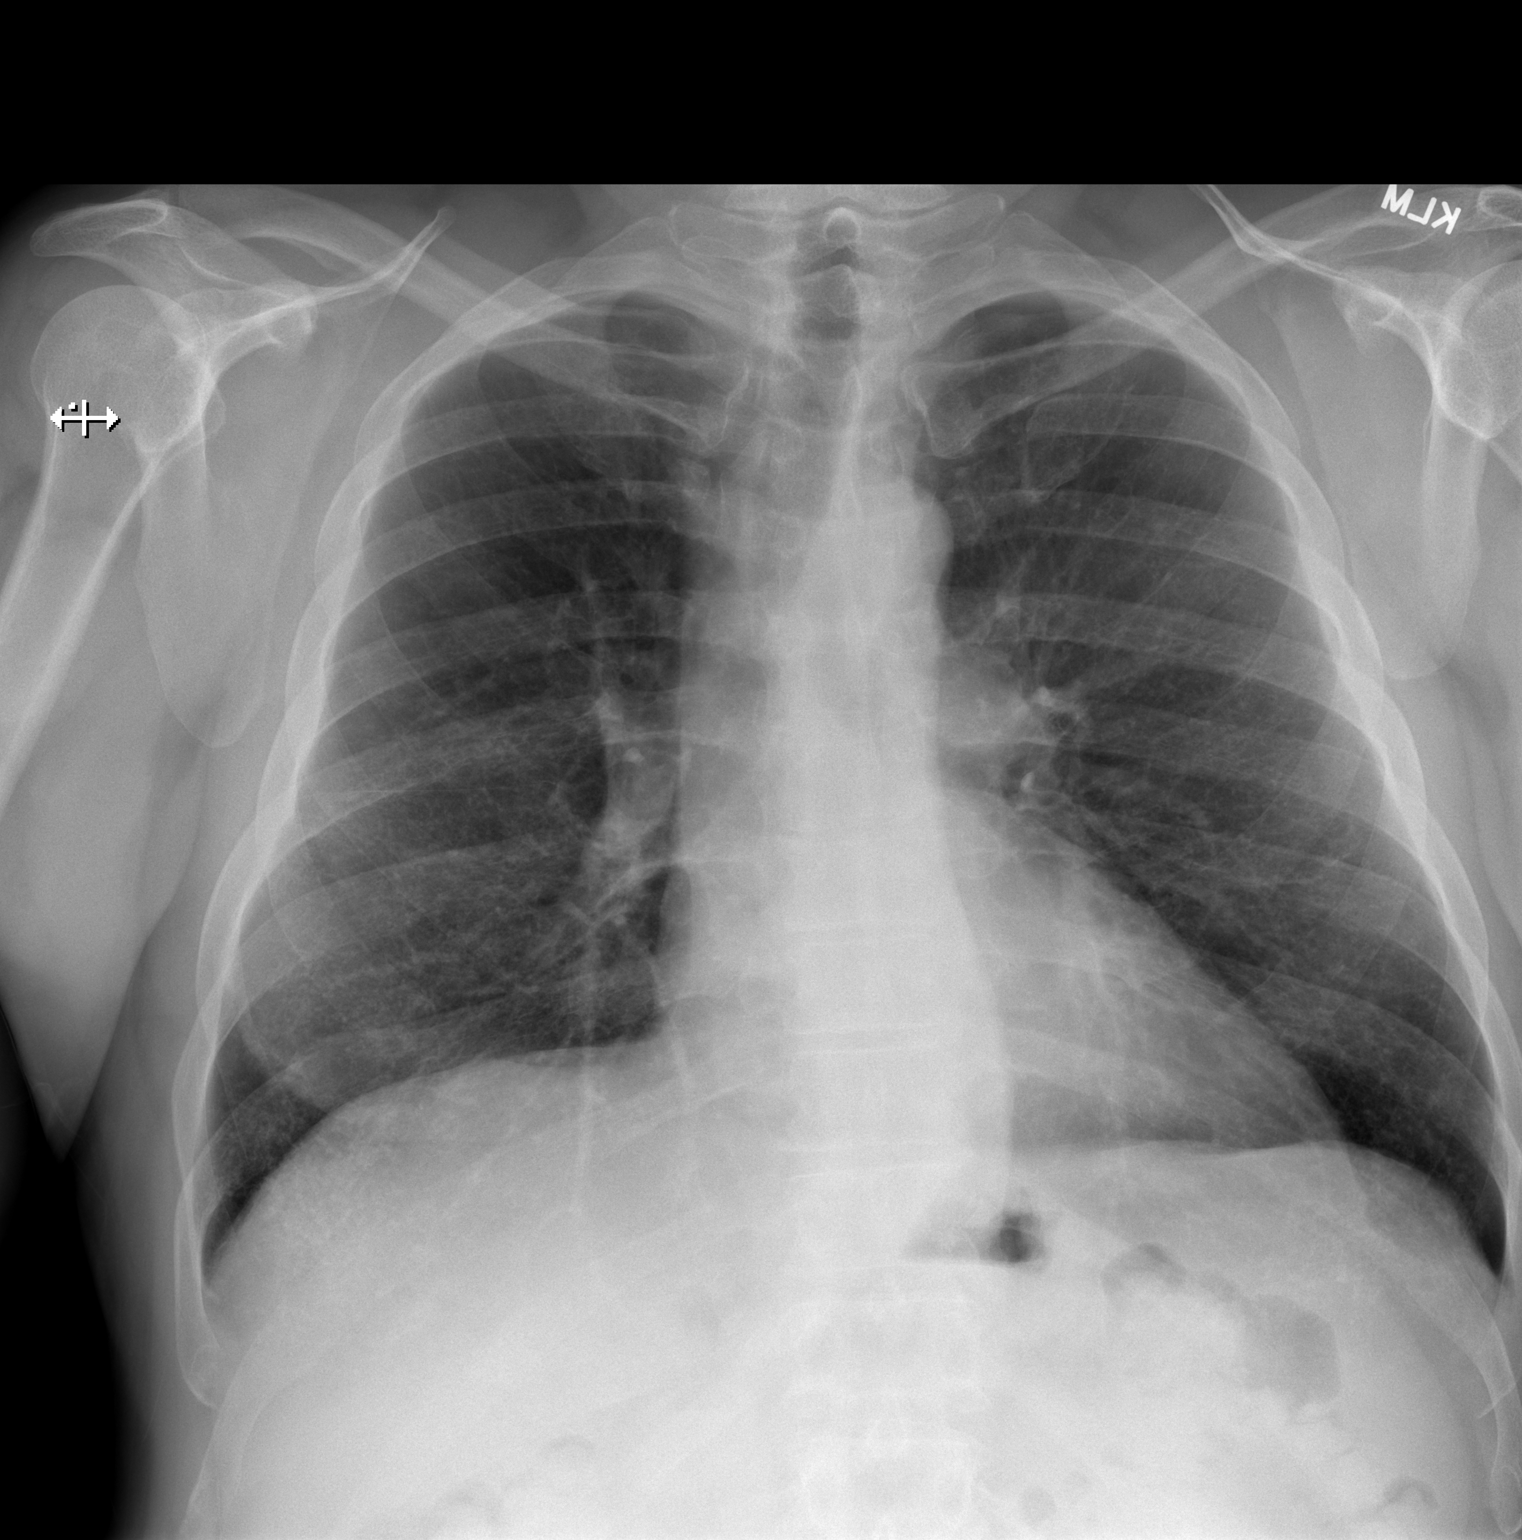

[w chest lat]
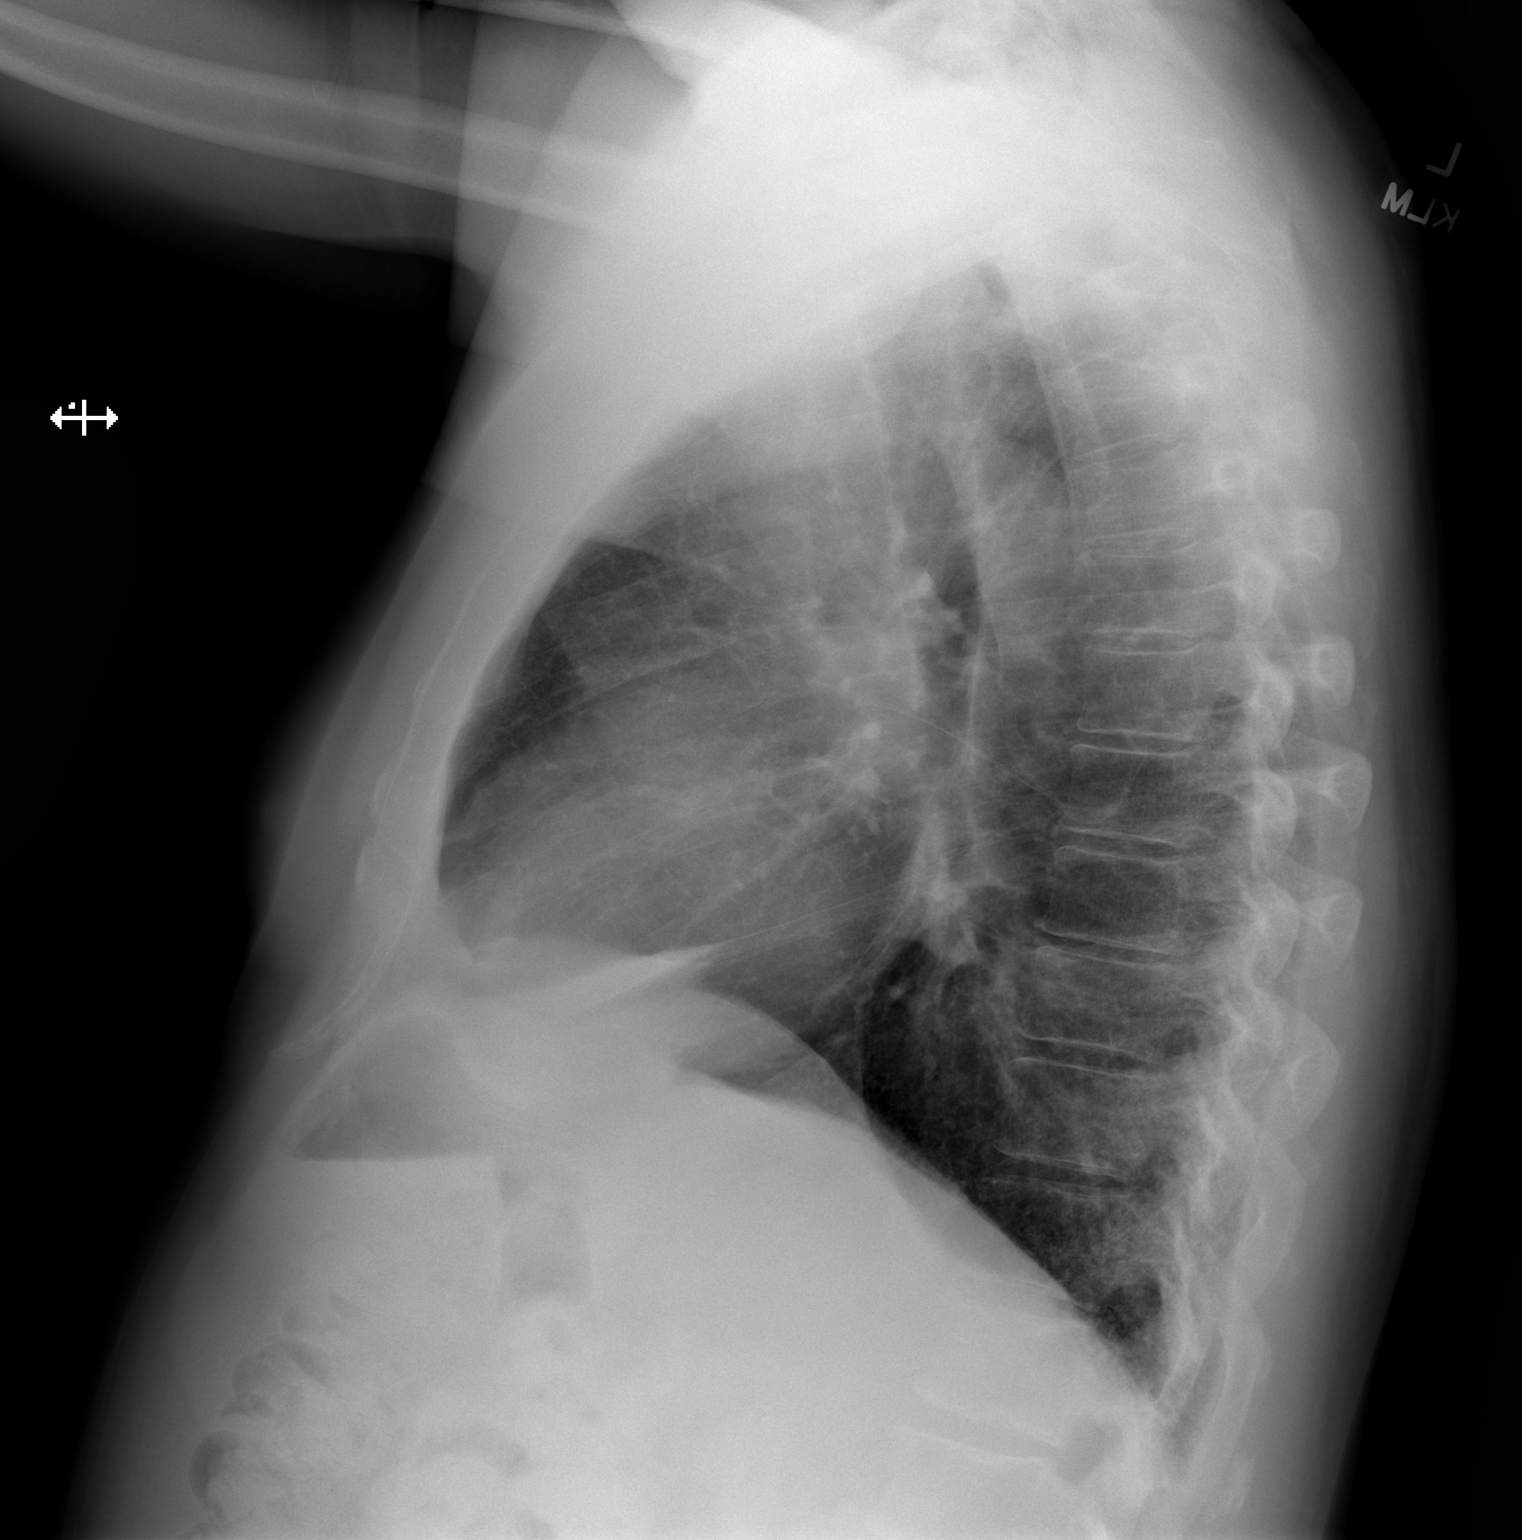

[2 of 2 positions shown; findings below may reference images not displayed]

FINDINGS: Apparent nipple shadow on the right. Lungs clear. Heart size and
pulmonary vascularity are normal. No adenopathy. No bone lesions.
IMPRESSION: No edema or airspace opacity.  Cardiac silhouette normal.

## 2021-07-24 DIAGNOSIS — E291 Testicular hypofunction: Secondary | ICD-10-CM | POA: Diagnosis not present

## 2021-08-20 DIAGNOSIS — E291 Testicular hypofunction: Secondary | ICD-10-CM | POA: Diagnosis not present

## 2021-09-03 ENCOUNTER — Encounter: Payer: Self-pay | Admitting: Gastroenterology

## 2021-09-05 DIAGNOSIS — E291 Testicular hypofunction: Secondary | ICD-10-CM | POA: Diagnosis not present

## 2021-09-18 ENCOUNTER — Encounter: Payer: Self-pay | Admitting: Gastroenterology

## 2021-09-23 DIAGNOSIS — I10 Essential (primary) hypertension: Secondary | ICD-10-CM | POA: Diagnosis not present

## 2021-09-23 DIAGNOSIS — E291 Testicular hypofunction: Secondary | ICD-10-CM | POA: Diagnosis not present

## 2021-09-23 DIAGNOSIS — E785 Hyperlipidemia, unspecified: Secondary | ICD-10-CM | POA: Diagnosis not present

## 2021-09-23 DIAGNOSIS — E1142 Type 2 diabetes mellitus with diabetic polyneuropathy: Secondary | ICD-10-CM | POA: Diagnosis not present

## 2021-09-23 DIAGNOSIS — F419 Anxiety disorder, unspecified: Secondary | ICD-10-CM | POA: Diagnosis not present

## 2021-09-23 DIAGNOSIS — E1165 Type 2 diabetes mellitus with hyperglycemia: Secondary | ICD-10-CM | POA: Diagnosis not present

## 2021-09-23 DIAGNOSIS — F331 Major depressive disorder, recurrent, moderate: Secondary | ICD-10-CM | POA: Diagnosis not present

## 2021-09-23 DIAGNOSIS — E559 Vitamin D deficiency, unspecified: Secondary | ICD-10-CM | POA: Diagnosis not present

## 2021-09-25 DIAGNOSIS — Z1331 Encounter for screening for depression: Secondary | ICD-10-CM | POA: Diagnosis not present

## 2021-09-25 DIAGNOSIS — E785 Hyperlipidemia, unspecified: Secondary | ICD-10-CM | POA: Diagnosis not present

## 2021-09-25 DIAGNOSIS — E669 Obesity, unspecified: Secondary | ICD-10-CM | POA: Diagnosis not present

## 2021-09-25 DIAGNOSIS — Z9181 History of falling: Secondary | ICD-10-CM | POA: Diagnosis not present

## 2021-09-25 DIAGNOSIS — Z0000 Encounter for general adult medical examination without abnormal findings: Secondary | ICD-10-CM | POA: Diagnosis not present

## 2021-09-25 DIAGNOSIS — Z6829 Body mass index (BMI) 29.0-29.9, adult: Secondary | ICD-10-CM | POA: Diagnosis not present

## 2021-10-28 DIAGNOSIS — E291 Testicular hypofunction: Secondary | ICD-10-CM | POA: Diagnosis not present

## 2021-11-13 DIAGNOSIS — E291 Testicular hypofunction: Secondary | ICD-10-CM | POA: Diagnosis not present

## 2021-11-27 DIAGNOSIS — R7989 Other specified abnormal findings of blood chemistry: Secondary | ICD-10-CM | POA: Diagnosis not present

## 2021-11-29 DIAGNOSIS — E291 Testicular hypofunction: Secondary | ICD-10-CM | POA: Diagnosis not present

## 2021-12-18 DIAGNOSIS — E291 Testicular hypofunction: Secondary | ICD-10-CM | POA: Diagnosis not present

## 2021-12-27 DIAGNOSIS — F419 Anxiety disorder, unspecified: Secondary | ICD-10-CM | POA: Diagnosis not present

## 2021-12-27 DIAGNOSIS — E785 Hyperlipidemia, unspecified: Secondary | ICD-10-CM | POA: Diagnosis not present

## 2021-12-27 DIAGNOSIS — E291 Testicular hypofunction: Secondary | ICD-10-CM | POA: Diagnosis not present

## 2021-12-27 DIAGNOSIS — E559 Vitamin D deficiency, unspecified: Secondary | ICD-10-CM | POA: Diagnosis not present

## 2021-12-27 DIAGNOSIS — E1165 Type 2 diabetes mellitus with hyperglycemia: Secondary | ICD-10-CM | POA: Diagnosis not present

## 2021-12-27 DIAGNOSIS — I10 Essential (primary) hypertension: Secondary | ICD-10-CM | POA: Diagnosis not present

## 2021-12-27 DIAGNOSIS — E1142 Type 2 diabetes mellitus with diabetic polyneuropathy: Secondary | ICD-10-CM | POA: Diagnosis not present

## 2021-12-27 DIAGNOSIS — F331 Major depressive disorder, recurrent, moderate: Secondary | ICD-10-CM | POA: Diagnosis not present

## 2022-01-10 DIAGNOSIS — I10 Essential (primary) hypertension: Secondary | ICD-10-CM | POA: Diagnosis not present

## 2022-01-10 DIAGNOSIS — Z7984 Long term (current) use of oral hypoglycemic drugs: Secondary | ICD-10-CM | POA: Diagnosis not present

## 2022-01-10 DIAGNOSIS — E119 Type 2 diabetes mellitus without complications: Secondary | ICD-10-CM | POA: Diagnosis not present

## 2022-01-10 DIAGNOSIS — K621 Rectal polyp: Secondary | ICD-10-CM | POA: Diagnosis not present

## 2022-01-10 DIAGNOSIS — Z86010 Personal history of colonic polyps: Secondary | ICD-10-CM | POA: Diagnosis not present

## 2022-01-13 DIAGNOSIS — R339 Retention of urine, unspecified: Secondary | ICD-10-CM | POA: Diagnosis not present

## 2022-01-13 DIAGNOSIS — N401 Enlarged prostate with lower urinary tract symptoms: Secondary | ICD-10-CM | POA: Diagnosis not present

## 2022-01-13 DIAGNOSIS — E119 Type 2 diabetes mellitus without complications: Secondary | ICD-10-CM | POA: Diagnosis not present

## 2022-01-15 DIAGNOSIS — E291 Testicular hypofunction: Secondary | ICD-10-CM | POA: Diagnosis not present

## 2022-02-06 DIAGNOSIS — E291 Testicular hypofunction: Secondary | ICD-10-CM | POA: Diagnosis not present

## 2022-02-26 DIAGNOSIS — E291 Testicular hypofunction: Secondary | ICD-10-CM | POA: Diagnosis not present

## 2022-03-12 DIAGNOSIS — E291 Testicular hypofunction: Secondary | ICD-10-CM | POA: Diagnosis not present

## 2022-03-26 DIAGNOSIS — E291 Testicular hypofunction: Secondary | ICD-10-CM | POA: Diagnosis not present

## 2022-04-01 DIAGNOSIS — E1142 Type 2 diabetes mellitus with diabetic polyneuropathy: Secondary | ICD-10-CM | POA: Diagnosis not present

## 2022-04-01 DIAGNOSIS — L814 Other melanin hyperpigmentation: Secondary | ICD-10-CM | POA: Diagnosis not present

## 2022-04-01 DIAGNOSIS — D2239 Melanocytic nevi of other parts of face: Secondary | ICD-10-CM | POA: Diagnosis not present

## 2022-04-01 DIAGNOSIS — E291 Testicular hypofunction: Secondary | ICD-10-CM | POA: Diagnosis not present

## 2022-04-01 DIAGNOSIS — E559 Vitamin D deficiency, unspecified: Secondary | ICD-10-CM | POA: Diagnosis not present

## 2022-04-01 DIAGNOSIS — E1165 Type 2 diabetes mellitus with hyperglycemia: Secondary | ICD-10-CM | POA: Diagnosis not present

## 2022-04-01 DIAGNOSIS — D225 Melanocytic nevi of trunk: Secondary | ICD-10-CM | POA: Diagnosis not present

## 2022-04-01 DIAGNOSIS — F331 Major depressive disorder, recurrent, moderate: Secondary | ICD-10-CM | POA: Diagnosis not present

## 2022-04-01 DIAGNOSIS — F419 Anxiety disorder, unspecified: Secondary | ICD-10-CM | POA: Diagnosis not present

## 2022-04-01 DIAGNOSIS — L821 Other seborrheic keratosis: Secondary | ICD-10-CM | POA: Diagnosis not present

## 2022-04-01 DIAGNOSIS — E785 Hyperlipidemia, unspecified: Secondary | ICD-10-CM | POA: Diagnosis not present

## 2022-04-01 DIAGNOSIS — I10 Essential (primary) hypertension: Secondary | ICD-10-CM | POA: Diagnosis not present

## 2022-04-16 DIAGNOSIS — E291 Testicular hypofunction: Secondary | ICD-10-CM | POA: Diagnosis not present

## 2022-05-06 DIAGNOSIS — E291 Testicular hypofunction: Secondary | ICD-10-CM | POA: Diagnosis not present

## 2022-05-20 DIAGNOSIS — E291 Testicular hypofunction: Secondary | ICD-10-CM | POA: Diagnosis not present

## 2022-07-01 DIAGNOSIS — E1142 Type 2 diabetes mellitus with diabetic polyneuropathy: Secondary | ICD-10-CM | POA: Diagnosis not present

## 2022-07-01 DIAGNOSIS — I10 Essential (primary) hypertension: Secondary | ICD-10-CM | POA: Diagnosis not present

## 2022-07-01 DIAGNOSIS — E559 Vitamin D deficiency, unspecified: Secondary | ICD-10-CM | POA: Diagnosis not present

## 2022-07-01 DIAGNOSIS — F331 Major depressive disorder, recurrent, moderate: Secondary | ICD-10-CM | POA: Diagnosis not present

## 2022-07-01 DIAGNOSIS — E291 Testicular hypofunction: Secondary | ICD-10-CM | POA: Diagnosis not present

## 2022-07-01 DIAGNOSIS — Z139 Encounter for screening, unspecified: Secondary | ICD-10-CM | POA: Diagnosis not present

## 2022-07-01 DIAGNOSIS — E1165 Type 2 diabetes mellitus with hyperglycemia: Secondary | ICD-10-CM | POA: Diagnosis not present

## 2022-07-01 DIAGNOSIS — Z125 Encounter for screening for malignant neoplasm of prostate: Secondary | ICD-10-CM | POA: Diagnosis not present

## 2022-07-01 DIAGNOSIS — E785 Hyperlipidemia, unspecified: Secondary | ICD-10-CM | POA: Diagnosis not present

## 2022-07-01 DIAGNOSIS — F419 Anxiety disorder, unspecified: Secondary | ICD-10-CM | POA: Diagnosis not present

## 2022-07-08 DIAGNOSIS — L209 Atopic dermatitis, unspecified: Secondary | ICD-10-CM | POA: Diagnosis not present

## 2022-07-24 DIAGNOSIS — E291 Testicular hypofunction: Secondary | ICD-10-CM | POA: Diagnosis not present

## 2022-08-22 DIAGNOSIS — N401 Enlarged prostate with lower urinary tract symptoms: Secondary | ICD-10-CM | POA: Diagnosis not present

## 2022-08-22 DIAGNOSIS — Z125 Encounter for screening for malignant neoplasm of prostate: Secondary | ICD-10-CM | POA: Diagnosis not present

## 2022-08-22 DIAGNOSIS — R972 Elevated prostate specific antigen [PSA]: Secondary | ICD-10-CM | POA: Diagnosis not present

## 2022-08-22 DIAGNOSIS — Z79899 Other long term (current) drug therapy: Secondary | ICD-10-CM | POA: Diagnosis not present

## 2022-08-22 DIAGNOSIS — R339 Retention of urine, unspecified: Secondary | ICD-10-CM | POA: Diagnosis not present

## 2022-08-26 DIAGNOSIS — E291 Testicular hypofunction: Secondary | ICD-10-CM | POA: Diagnosis not present

## 2022-09-09 DIAGNOSIS — E291 Testicular hypofunction: Secondary | ICD-10-CM | POA: Diagnosis not present

## 2022-09-10 DIAGNOSIS — R339 Retention of urine, unspecified: Secondary | ICD-10-CM | POA: Diagnosis not present

## 2022-09-10 DIAGNOSIS — N4889 Other specified disorders of penis: Secondary | ICD-10-CM | POA: Diagnosis not present

## 2022-09-10 DIAGNOSIS — N401 Enlarged prostate with lower urinary tract symptoms: Secondary | ICD-10-CM | POA: Diagnosis not present

## 2022-10-09 DIAGNOSIS — E291 Testicular hypofunction: Secondary | ICD-10-CM | POA: Diagnosis not present

## 2022-10-28 DIAGNOSIS — E291 Testicular hypofunction: Secondary | ICD-10-CM | POA: Diagnosis not present

## 2022-11-12 DIAGNOSIS — E291 Testicular hypofunction: Secondary | ICD-10-CM | POA: Diagnosis not present

## 2022-11-18 DIAGNOSIS — E1142 Type 2 diabetes mellitus with diabetic polyneuropathy: Secondary | ICD-10-CM | POA: Diagnosis not present

## 2022-11-18 DIAGNOSIS — M25511 Pain in right shoulder: Secondary | ICD-10-CM | POA: Diagnosis not present

## 2022-11-18 DIAGNOSIS — E785 Hyperlipidemia, unspecified: Secondary | ICD-10-CM | POA: Diagnosis not present

## 2022-11-18 DIAGNOSIS — M25512 Pain in left shoulder: Secondary | ICD-10-CM | POA: Diagnosis not present

## 2022-11-18 DIAGNOSIS — I10 Essential (primary) hypertension: Secondary | ICD-10-CM | POA: Diagnosis not present

## 2022-11-18 DIAGNOSIS — E559 Vitamin D deficiency, unspecified: Secondary | ICD-10-CM | POA: Diagnosis not present

## 2022-11-18 DIAGNOSIS — E1165 Type 2 diabetes mellitus with hyperglycemia: Secondary | ICD-10-CM | POA: Diagnosis not present

## 2022-11-18 DIAGNOSIS — E291 Testicular hypofunction: Secondary | ICD-10-CM | POA: Diagnosis not present

## 2022-11-27 DIAGNOSIS — E291 Testicular hypofunction: Secondary | ICD-10-CM | POA: Diagnosis not present

## 2022-12-17 DIAGNOSIS — E291 Testicular hypofunction: Secondary | ICD-10-CM | POA: Diagnosis not present

## 2022-12-31 DIAGNOSIS — E291 Testicular hypofunction: Secondary | ICD-10-CM | POA: Diagnosis not present

## 2023-01-15 DIAGNOSIS — E291 Testicular hypofunction: Secondary | ICD-10-CM | POA: Diagnosis not present

## 2023-01-15 DIAGNOSIS — E119 Type 2 diabetes mellitus without complications: Secondary | ICD-10-CM | POA: Diagnosis not present

## 2023-02-04 DIAGNOSIS — E291 Testicular hypofunction: Secondary | ICD-10-CM | POA: Diagnosis not present

## 2023-02-24 DIAGNOSIS — N4889 Other specified disorders of penis: Secondary | ICD-10-CM | POA: Diagnosis not present

## 2023-02-24 DIAGNOSIS — R339 Retention of urine, unspecified: Secondary | ICD-10-CM | POA: Diagnosis not present

## 2023-02-24 DIAGNOSIS — N401 Enlarged prostate with lower urinary tract symptoms: Secondary | ICD-10-CM | POA: Diagnosis not present

## 2023-02-24 DIAGNOSIS — Z9181 History of falling: Secondary | ICD-10-CM | POA: Diagnosis not present

## 2023-02-24 DIAGNOSIS — Z0000 Encounter for general adult medical examination without abnormal findings: Secondary | ICD-10-CM | POA: Diagnosis not present

## 2023-02-25 DIAGNOSIS — E1165 Type 2 diabetes mellitus with hyperglycemia: Secondary | ICD-10-CM | POA: Diagnosis not present

## 2023-02-25 DIAGNOSIS — E1142 Type 2 diabetes mellitus with diabetic polyneuropathy: Secondary | ICD-10-CM | POA: Diagnosis not present

## 2023-02-25 DIAGNOSIS — E559 Vitamin D deficiency, unspecified: Secondary | ICD-10-CM | POA: Diagnosis not present

## 2023-02-25 DIAGNOSIS — I10 Essential (primary) hypertension: Secondary | ICD-10-CM | POA: Diagnosis not present

## 2023-02-25 DIAGNOSIS — E785 Hyperlipidemia, unspecified: Secondary | ICD-10-CM | POA: Diagnosis not present

## 2023-02-25 DIAGNOSIS — Z9181 History of falling: Secondary | ICD-10-CM | POA: Diagnosis not present

## 2023-02-25 DIAGNOSIS — E291 Testicular hypofunction: Secondary | ICD-10-CM | POA: Diagnosis not present

## 2023-03-12 DIAGNOSIS — E291 Testicular hypofunction: Secondary | ICD-10-CM | POA: Diagnosis not present

## 2023-03-17 DIAGNOSIS — D1722 Benign lipomatous neoplasm of skin and subcutaneous tissue of left arm: Secondary | ICD-10-CM | POA: Diagnosis not present

## 2023-03-26 DIAGNOSIS — E291 Testicular hypofunction: Secondary | ICD-10-CM | POA: Diagnosis not present

## 2023-04-23 DIAGNOSIS — E291 Testicular hypofunction: Secondary | ICD-10-CM | POA: Diagnosis not present

## 2023-05-07 DIAGNOSIS — E291 Testicular hypofunction: Secondary | ICD-10-CM | POA: Diagnosis not present

## 2023-05-19 DIAGNOSIS — D2239 Melanocytic nevi of other parts of face: Secondary | ICD-10-CM | POA: Diagnosis not present

## 2023-05-19 DIAGNOSIS — L578 Other skin changes due to chronic exposure to nonionizing radiation: Secondary | ICD-10-CM | POA: Diagnosis not present

## 2023-05-19 DIAGNOSIS — D225 Melanocytic nevi of trunk: Secondary | ICD-10-CM | POA: Diagnosis not present

## 2023-05-19 DIAGNOSIS — L821 Other seborrheic keratosis: Secondary | ICD-10-CM | POA: Diagnosis not present

## 2023-05-21 DIAGNOSIS — E291 Testicular hypofunction: Secondary | ICD-10-CM | POA: Diagnosis not present

## 2023-06-15 DIAGNOSIS — E291 Testicular hypofunction: Secondary | ICD-10-CM | POA: Diagnosis not present

## 2023-06-15 DIAGNOSIS — M25511 Pain in right shoulder: Secondary | ICD-10-CM | POA: Diagnosis not present

## 2023-06-15 DIAGNOSIS — F419 Anxiety disorder, unspecified: Secondary | ICD-10-CM | POA: Diagnosis not present

## 2023-06-15 DIAGNOSIS — E1142 Type 2 diabetes mellitus with diabetic polyneuropathy: Secondary | ICD-10-CM | POA: Diagnosis not present

## 2023-06-15 DIAGNOSIS — F331 Major depressive disorder, recurrent, moderate: Secondary | ICD-10-CM | POA: Diagnosis not present

## 2023-06-15 DIAGNOSIS — E559 Vitamin D deficiency, unspecified: Secondary | ICD-10-CM | POA: Diagnosis not present

## 2023-06-15 DIAGNOSIS — E1165 Type 2 diabetes mellitus with hyperglycemia: Secondary | ICD-10-CM | POA: Diagnosis not present

## 2023-06-15 DIAGNOSIS — E785 Hyperlipidemia, unspecified: Secondary | ICD-10-CM | POA: Diagnosis not present

## 2023-06-23 DIAGNOSIS — M25511 Pain in right shoulder: Secondary | ICD-10-CM | POA: Diagnosis not present

## 2023-07-08 DIAGNOSIS — E291 Testicular hypofunction: Secondary | ICD-10-CM | POA: Diagnosis not present

## 2023-07-16 DIAGNOSIS — S46811A Strain of other muscles, fascia and tendons at shoulder and upper arm level, right arm, initial encounter: Secondary | ICD-10-CM | POA: Diagnosis not present

## 2023-07-16 DIAGNOSIS — X58XXXA Exposure to other specified factors, initial encounter: Secondary | ICD-10-CM | POA: Diagnosis not present

## 2023-07-29 DIAGNOSIS — E291 Testicular hypofunction: Secondary | ICD-10-CM | POA: Diagnosis not present

## 2023-08-19 DIAGNOSIS — E291 Testicular hypofunction: Secondary | ICD-10-CM | POA: Diagnosis not present

## 2023-08-31 DIAGNOSIS — K1120 Sialoadenitis, unspecified: Secondary | ICD-10-CM | POA: Diagnosis not present

## 2023-08-31 DIAGNOSIS — E1165 Type 2 diabetes mellitus with hyperglycemia: Secondary | ICD-10-CM | POA: Diagnosis not present

## 2023-08-31 DIAGNOSIS — E1142 Type 2 diabetes mellitus with diabetic polyneuropathy: Secondary | ICD-10-CM | POA: Diagnosis not present

## 2023-09-02 DIAGNOSIS — M25511 Pain in right shoulder: Secondary | ICD-10-CM | POA: Diagnosis not present

## 2023-09-02 DIAGNOSIS — E291 Testicular hypofunction: Secondary | ICD-10-CM | POA: Diagnosis not present

## 2023-09-16 DIAGNOSIS — E1165 Type 2 diabetes mellitus with hyperglycemia: Secondary | ICD-10-CM | POA: Diagnosis not present

## 2023-09-16 DIAGNOSIS — E559 Vitamin D deficiency, unspecified: Secondary | ICD-10-CM | POA: Diagnosis not present

## 2023-09-16 DIAGNOSIS — F331 Major depressive disorder, recurrent, moderate: Secondary | ICD-10-CM | POA: Diagnosis not present

## 2023-09-16 DIAGNOSIS — E785 Hyperlipidemia, unspecified: Secondary | ICD-10-CM | POA: Diagnosis not present

## 2023-09-16 DIAGNOSIS — E1142 Type 2 diabetes mellitus with diabetic polyneuropathy: Secondary | ICD-10-CM | POA: Diagnosis not present

## 2023-09-16 DIAGNOSIS — E291 Testicular hypofunction: Secondary | ICD-10-CM | POA: Diagnosis not present

## 2023-09-16 DIAGNOSIS — Z125 Encounter for screening for malignant neoplasm of prostate: Secondary | ICD-10-CM | POA: Diagnosis not present

## 2023-10-16 DIAGNOSIS — E291 Testicular hypofunction: Secondary | ICD-10-CM | POA: Diagnosis not present

## 2023-10-16 DIAGNOSIS — R21 Rash and other nonspecific skin eruption: Secondary | ICD-10-CM | POA: Diagnosis not present

## 2023-10-16 DIAGNOSIS — R2241 Localized swelling, mass and lump, right lower limb: Secondary | ICD-10-CM | POA: Diagnosis not present

## 2023-10-21 DIAGNOSIS — M7121 Synovial cyst of popliteal space [Baker], right knee: Secondary | ICD-10-CM | POA: Diagnosis not present

## 2023-10-21 DIAGNOSIS — M25511 Pain in right shoulder: Secondary | ICD-10-CM | POA: Diagnosis not present

## 2023-10-26 DIAGNOSIS — M25511 Pain in right shoulder: Secondary | ICD-10-CM | POA: Diagnosis not present

## 2023-11-10 DIAGNOSIS — M25511 Pain in right shoulder: Secondary | ICD-10-CM | POA: Diagnosis not present

## 2023-11-11 DIAGNOSIS — E291 Testicular hypofunction: Secondary | ICD-10-CM | POA: Diagnosis not present

## 2023-11-16 DIAGNOSIS — M25511 Pain in right shoulder: Secondary | ICD-10-CM | POA: Diagnosis not present

## 2023-11-17 DIAGNOSIS — M79604 Pain in right leg: Secondary | ICD-10-CM | POA: Diagnosis not present

## 2023-11-17 DIAGNOSIS — M67439 Ganglion, unspecified wrist: Secondary | ICD-10-CM | POA: Diagnosis not present

## 2023-11-17 DIAGNOSIS — M67431 Ganglion, right wrist: Secondary | ICD-10-CM | POA: Diagnosis not present

## 2023-11-23 DIAGNOSIS — M25511 Pain in right shoulder: Secondary | ICD-10-CM | POA: Diagnosis not present

## 2023-11-23 DIAGNOSIS — M67431 Ganglion, right wrist: Secondary | ICD-10-CM | POA: Diagnosis not present

## 2023-12-02 DIAGNOSIS — E291 Testicular hypofunction: Secondary | ICD-10-CM | POA: Diagnosis not present

## 2023-12-02 DIAGNOSIS — M25511 Pain in right shoulder: Secondary | ICD-10-CM | POA: Diagnosis not present

## 2023-12-03 DIAGNOSIS — M25511 Pain in right shoulder: Secondary | ICD-10-CM | POA: Diagnosis not present

## 2023-12-10 DIAGNOSIS — M25511 Pain in right shoulder: Secondary | ICD-10-CM | POA: Diagnosis not present

## 2023-12-18 DIAGNOSIS — E785 Hyperlipidemia, unspecified: Secondary | ICD-10-CM | POA: Diagnosis not present

## 2023-12-18 DIAGNOSIS — F331 Major depressive disorder, recurrent, moderate: Secondary | ICD-10-CM | POA: Diagnosis not present

## 2023-12-18 DIAGNOSIS — E559 Vitamin D deficiency, unspecified: Secondary | ICD-10-CM | POA: Diagnosis not present

## 2023-12-18 DIAGNOSIS — E1142 Type 2 diabetes mellitus with diabetic polyneuropathy: Secondary | ICD-10-CM | POA: Diagnosis not present

## 2023-12-18 DIAGNOSIS — E291 Testicular hypofunction: Secondary | ICD-10-CM | POA: Diagnosis not present

## 2023-12-18 DIAGNOSIS — R197 Diarrhea, unspecified: Secondary | ICD-10-CM | POA: Diagnosis not present

## 2023-12-18 DIAGNOSIS — I10 Essential (primary) hypertension: Secondary | ICD-10-CM | POA: Diagnosis not present

## 2023-12-21 DIAGNOSIS — M25511 Pain in right shoulder: Secondary | ICD-10-CM | POA: Diagnosis not present

## 2023-12-24 DIAGNOSIS — G8918 Other acute postprocedural pain: Secondary | ICD-10-CM | POA: Diagnosis not present

## 2023-12-24 DIAGNOSIS — M67431 Ganglion, right wrist: Secondary | ICD-10-CM | POA: Diagnosis not present

## 2024-01-06 DIAGNOSIS — E291 Testicular hypofunction: Secondary | ICD-10-CM | POA: Diagnosis not present

## 2024-01-20 DIAGNOSIS — E291 Testicular hypofunction: Secondary | ICD-10-CM | POA: Diagnosis not present

## 2024-02-08 DIAGNOSIS — E119 Type 2 diabetes mellitus without complications: Secondary | ICD-10-CM | POA: Diagnosis not present

## 2024-02-10 DIAGNOSIS — E291 Testicular hypofunction: Secondary | ICD-10-CM | POA: Diagnosis not present

## 2024-02-24 DIAGNOSIS — E291 Testicular hypofunction: Secondary | ICD-10-CM | POA: Diagnosis not present

## 2024-03-01 DIAGNOSIS — R972 Elevated prostate specific antigen [PSA]: Secondary | ICD-10-CM | POA: Diagnosis not present

## 2024-03-01 DIAGNOSIS — E291 Testicular hypofunction: Secondary | ICD-10-CM | POA: Diagnosis not present

## 2024-03-01 DIAGNOSIS — N4889 Other specified disorders of penis: Secondary | ICD-10-CM | POA: Diagnosis not present

## 2024-03-01 DIAGNOSIS — N401 Enlarged prostate with lower urinary tract symptoms: Secondary | ICD-10-CM | POA: Diagnosis not present

## 2024-03-01 DIAGNOSIS — R339 Retention of urine, unspecified: Secondary | ICD-10-CM | POA: Diagnosis not present

## 2024-03-02 DIAGNOSIS — Z9181 History of falling: Secondary | ICD-10-CM | POA: Diagnosis not present

## 2024-03-02 DIAGNOSIS — Z0000 Encounter for general adult medical examination without abnormal findings: Secondary | ICD-10-CM | POA: Diagnosis not present

## 2024-03-23 DIAGNOSIS — E291 Testicular hypofunction: Secondary | ICD-10-CM | POA: Diagnosis not present

## 2024-03-24 DIAGNOSIS — E559 Vitamin D deficiency, unspecified: Secondary | ICD-10-CM | POA: Diagnosis not present

## 2024-03-24 DIAGNOSIS — F419 Anxiety disorder, unspecified: Secondary | ICD-10-CM | POA: Diagnosis not present

## 2024-03-24 DIAGNOSIS — R300 Dysuria: Secondary | ICD-10-CM | POA: Diagnosis not present

## 2024-03-24 DIAGNOSIS — E1142 Type 2 diabetes mellitus with diabetic polyneuropathy: Secondary | ICD-10-CM | POA: Diagnosis not present

## 2024-03-24 DIAGNOSIS — N390 Urinary tract infection, site not specified: Secondary | ICD-10-CM | POA: Diagnosis not present

## 2024-03-24 DIAGNOSIS — F331 Major depressive disorder, recurrent, moderate: Secondary | ICD-10-CM | POA: Diagnosis not present

## 2024-03-24 DIAGNOSIS — E291 Testicular hypofunction: Secondary | ICD-10-CM | POA: Diagnosis not present

## 2024-03-24 DIAGNOSIS — R339 Retention of urine, unspecified: Secondary | ICD-10-CM | POA: Diagnosis not present

## 2024-03-24 DIAGNOSIS — I10 Essential (primary) hypertension: Secondary | ICD-10-CM | POA: Diagnosis not present

## 2024-03-24 DIAGNOSIS — E785 Hyperlipidemia, unspecified: Secondary | ICD-10-CM | POA: Diagnosis not present

## 2024-03-24 DIAGNOSIS — Z125 Encounter for screening for malignant neoplasm of prostate: Secondary | ICD-10-CM | POA: Diagnosis not present

## 2024-03-24 DIAGNOSIS — N401 Enlarged prostate with lower urinary tract symptoms: Secondary | ICD-10-CM | POA: Diagnosis not present

## 2024-05-03 DIAGNOSIS — R972 Elevated prostate specific antigen [PSA]: Secondary | ICD-10-CM | POA: Diagnosis not present

## 2024-05-03 DIAGNOSIS — N401 Enlarged prostate with lower urinary tract symptoms: Secondary | ICD-10-CM | POA: Diagnosis not present

## 2024-05-09 DIAGNOSIS — C61 Malignant neoplasm of prostate: Secondary | ICD-10-CM | POA: Diagnosis not present

## 2024-05-09 DIAGNOSIS — R972 Elevated prostate specific antigen [PSA]: Secondary | ICD-10-CM | POA: Diagnosis not present

## 2024-05-13 ENCOUNTER — Other Ambulatory Visit (HOSPITAL_BASED_OUTPATIENT_CLINIC_OR_DEPARTMENT_OTHER): Payer: Self-pay | Admitting: Urology

## 2024-05-13 DIAGNOSIS — C61 Malignant neoplasm of prostate: Secondary | ICD-10-CM

## 2024-05-24 DIAGNOSIS — L814 Other melanin hyperpigmentation: Secondary | ICD-10-CM | POA: Diagnosis not present

## 2024-05-24 DIAGNOSIS — D225 Melanocytic nevi of trunk: Secondary | ICD-10-CM | POA: Diagnosis not present

## 2024-05-24 DIAGNOSIS — L821 Other seborrheic keratosis: Secondary | ICD-10-CM | POA: Diagnosis not present

## 2024-05-24 DIAGNOSIS — D485 Neoplasm of uncertain behavior of skin: Secondary | ICD-10-CM | POA: Diagnosis not present

## 2024-05-24 DIAGNOSIS — D2239 Melanocytic nevi of other parts of face: Secondary | ICD-10-CM | POA: Diagnosis not present

## 2024-05-30 ENCOUNTER — Ambulatory Visit (HOSPITAL_BASED_OUTPATIENT_CLINIC_OR_DEPARTMENT_OTHER)
Admission: RE | Admit: 2024-05-30 | Discharge: 2024-05-30 | Disposition: A | Source: Ambulatory Visit | Attending: Urology | Admitting: Urology

## 2024-05-30 DIAGNOSIS — C61 Malignant neoplasm of prostate: Secondary | ICD-10-CM | POA: Diagnosis not present

## 2024-05-30 MED ORDER — FLOTUFOLASTAT F 18 GALLIUM 296-5846 MBQ/ML IV SOLN
9.6400 | Freq: Once | INTRAVENOUS | Status: AC
  Administered 2024-05-30: 7.54 via INTRAVENOUS

## 2024-05-31 DIAGNOSIS — R972 Elevated prostate specific antigen [PSA]: Secondary | ICD-10-CM | POA: Diagnosis not present

## 2024-05-31 DIAGNOSIS — C61 Malignant neoplasm of prostate: Secondary | ICD-10-CM | POA: Diagnosis not present
